# Patient Record
Sex: Female | Born: 1995 | Race: White | Hispanic: No | Marital: Single | Smoking: Never smoker
Health system: Southern US, Community
[De-identification: ages and names within clinical notes are randomized; demographics above are authoritative.]

## PROBLEM LIST (undated history)

## (undated) ENCOUNTER — Inpatient Hospital Stay (HOSPITAL_COMMUNITY): Payer: Self-pay

## (undated) DIAGNOSIS — Z3490 Encounter for supervision of normal pregnancy, unspecified, unspecified trimester: Secondary | ICD-10-CM

## (undated) DIAGNOSIS — D649 Anemia, unspecified: Secondary | ICD-10-CM

## (undated) HISTORY — PX: NO PAST SURGERIES: SHX2092

## (undated) HISTORY — DX: Anemia, unspecified: D64.9

---
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
## 2016-01-02 ENCOUNTER — Other Ambulatory Visit: Payer: Self-pay | Admitting: Obstetrics and Gynecology

## 2016-01-02 DIAGNOSIS — O209 Hemorrhage in early pregnancy, unspecified: Secondary | ICD-10-CM

## 2016-01-13 ENCOUNTER — Encounter: Payer: Self-pay | Admitting: Family

## 2016-01-13 ENCOUNTER — Ambulatory Visit (INDEPENDENT_AMBULATORY_CARE_PROVIDER_SITE_OTHER): Payer: Medicaid Other | Admitting: Clinical

## 2016-01-13 ENCOUNTER — Ambulatory Visit (INDEPENDENT_AMBULATORY_CARE_PROVIDER_SITE_OTHER): Payer: Medicaid Other | Admitting: Family

## 2016-01-13 VITALS — BP 125/74 | HR 83 | Ht 64.0 in | Wt 112.2 lb

## 2016-01-13 DIAGNOSIS — F4321 Adjustment disorder with depressed mood: Secondary | ICD-10-CM

## 2016-01-13 DIAGNOSIS — O039 Complete or unspecified spontaneous abortion without complication: Secondary | ICD-10-CM | POA: Diagnosis not present

## 2016-01-13 NOTE — Progress Notes (Signed)
States never took the cytotec because she passed blood .   States bled a lot the first day after mau visit and had a lot of cramps.  Then bled like a period for a few days. Then no bleeding until 01/11/16 which she thinks is a period again although it is not as heavy. Denies pain.

## 2016-01-13 NOTE — Progress Notes (Signed)
History   [id number]   Chief Complaint  Patient presents with  . Miscarriage    HPI Shirley Hall is a 20 y.o. female G1P0 here for follow-up after presumed SAB.  Upon review of the records patient was first seen on 12/18/15 for lower abdominal cramping and spotting of blood.   BHCG on that day was 89.  Ultrasound showed IUGS and embryo measuring 10.6 mm with no cardiac activity.  Pt discharged home with cytotec, reports not taking because bleeding and cramping started the next day and lasted for approximately a week.   Pt here today with report of bleeding that started two days ago, no report of cramping.   All other systems negative.    Patient's last menstrual period was 01/11/2016.  OB History  Gravida Para Term Preterm AB Living  1            SAB TAB Ectopic Multiple Live Births               # Outcome Date GA Lbr Len/2nd Weight Sex Delivery Anes PTL Lv  1 Gravida               History reviewed. No pertinent past medical history.  History reviewed. No pertinent family history.  Social History   Social History  . Marital status: Single    Spouse name: N/A  . Number of children: N/A  . Years of education: N/A   Social History Main Topics  . Smoking status: Never Smoker  . Smokeless tobacco: Never Used  . Alcohol use No  . Drug use: No  . Sexual activity: Yes    Birth control/ protection: Condom   Other Topics Concern  . None   Social History Narrative  . None    Allergies  Allergen Reactions  . Amoxapine And Related Other (See Comments)    States father advised her family history of allergy    Current Outpatient Prescriptions on File Prior to Visit  Medication Sig Dispense Refill  . ibuprofen (ADVIL,MOTRIN) 600 MG tablet Take 1 tablet (600 mg total) by mouth every 6 (six) hours as needed for cramping. 30 tablet 1  . misoprostol (CYTOTEC) 200 MCG tablet If you do not pass pregnancy tissue within 24 hours after the 1st dose, take second dose. If you do  not pass pregnancy tissue within 24 hours after 2nd dose call the office at [phone]. (Patient not taking: Reported on 01/13/2016) 4 tablet 1   No current facility-administered medications on file prior to visit.    Review of Systems  Genitourinary: Positive for vaginal bleeding. Negative for pelvic pain.  All other systems reviewed and are negative.    Physical Exam   Vitals:   01/13/16 0754  BP: 125/74  Pulse: 83  Weight: 112 lb 3.2 oz (50.9 kg)  Height: 5\' 4"  (1.626 m)    Physical Exam  Constitutional: She is oriented to person, place, and time. She appears well-developed and well-nourished.  HENT:  Head: Normocephalic.  Neck: Normal range of motion. Neck supple.  Cardiovascular: Normal rate, regular rhythm and normal heart sounds.   Respiratory: Effort normal and breath sounds normal. No respiratory distress.  GI: Soft. There is no tenderness.  Genitourinary: Uterus is not enlarged. Cervix exhibits no motion tenderness. There is bleeding (scant) in the vagina.  Musculoskeletal: Normal range of motion. She exhibits no edema.  Neurological: She is alert and oriented to person, place, and time.  Skin: Skin is warm and dry.  Assessment and Plan  SAB Follow-up   Plan: Repeat BHCG Discussed miscarriages and it's occurrence; does not mean she'll have another at this time Discussed normalcy of emotions (sadness) and when to report  Marlis Edelson, CNM 01/13/2016 9:11 AM

## 2016-01-13 NOTE — BH Specialist Note (Addendum)
Session Start time: 8:10   End Time: 8:30 Total Time:  20 minutes Type of Service: Behavioral Health - Individual/Family Interpreter: No.   Interpreter Name & Language: n/a # [facility]   SUBJECTIVE: Shirley Hall is a 20 y.o. female  Pt. was referred by Rochele PagesKarim Walidah, CNM for:  depression. Pt. reports the following symptoms/concerns: Pt states that although she has felt mildly depressed since her miscarriage, she feels she is coping well by working at [employer] and keeping herself busy with household chores. Duration of problem:  Less than one month Severity: mild Previous treatment: none   OBJECTIVE: Mood: Appropriate & Affect: Appropriate Risk of harm to self or others: no known risk of harm to self or others Assessments administered: PHQ9: 10/ GAD7: 7  LIFE CONTEXT:  School/ Work: Enjoys working [employer] job at [employer] Life changes: Miscarriage What is important to pt/family (values): Staying busy   GOALS ADDRESSED:  -Alleviate symptoms of depression  INTERVENTIONS: Strength-based   ASSESSMENT:  Pt currently experiencing Adjustment disorder with depressed mood.  Pt may benefit from psychoeducation and brief therapeutic intervention regarding coping with symptoms of depression.      PLAN: 1. F/U with behavioral health clinician: as needed 2. Behavioral Health meds: none 3. Behavioral recommendations:  -Continue working new work position as long as it lasts -Continue keeping self productive with housework, as long as it is helpful -Consider reading educational material regarding coping with symptoms of depression -Consider [facility] group support, as needed 4. Referral: Brief Counseling/Psychotherapy and Psychoeducation   Rae LipsJamie C Mcmannes LCSWA Behavioral Health Clinician  Marlon PelWarmhandoff:   Warm Hand Off Completed.         Depression screen PHQ 2/9 01/13/2016  Decreased Interest 1  Down, Depressed, Hopeless 1  PHQ - 2  Score 2  Altered sleeping 2  Tired, decreased energy 2  Change in appetite 3  Feeling bad or failure about yourself  1  Trouble concentrating 0  Moving slowly or fidgety/restless 0  Suicidal thoughts 0  PHQ-9 Score 10   GAD 7 : Generalized Anxiety Score 01/13/2016  Nervous, Anxious, on Edge 1  Control/stop worrying 2  Worry too much - different things 2  Trouble relaxing 2  Restless 0  Easily annoyed or irritable 0  Afraid - awful might happen 0  Total GAD 7 Score 7

## 2016-01-14 LAB — HCG, QUANTITATIVE, PREGNANCY: hCG, Beta Chain, Quant, S: <2 m[IU]/mL

## 2016-01-15 ENCOUNTER — Encounter (HOSPITAL_COMMUNITY): Payer: Self-pay

## 2016-01-15 ENCOUNTER — Ambulatory Visit (HOSPITAL_COMMUNITY)
Admission: RE | Admit: 2016-01-15 | Discharge: 2016-01-15 | Disposition: A | Discharge status: Home / Self Care | Payer: Medicaid Other | Source: Ambulatory Visit | Attending: Obstetrics and Gynecology | Admitting: Obstetrics and Gynecology

## 2016-01-15 DIAGNOSIS — O209 Hemorrhage in early pregnancy, unspecified: Secondary | ICD-10-CM

## 2016-03-28 NOTE — L&D Delivery Note (Signed)
  Delivery Note After a 15 minute 2nd stage, at 7:50 AM a viable female was delivered via Vaginal, Spontaneous Delivery (Presentation: LOA;  ).The shoulders were not forthcoming d/t a compound left hand/arm. The posterior (left) arm was released and the baby delivered  Total dystocia 1.5 minutes.  At no time was any traction placed on the baby's head.After 1 minute, the cord was clamped and cut. 40 units of pitocin diluted in 1000cc LR was infused rapidly IV.  The placenta separated spontaneously and delivered via CCT and maternal pushing effort.  It was inspected and appears to be intact with a 3 VC.  APGAR: ,7/8 ; weight  pending.    .  Cord pH: 7.08  Anesthesia:  epidural Episiotomy: None Lacerations: None Suture Repair:  Est. Blood Loss (mL): 75  Mom to postpartum.  Baby to Couplet care / Skin to Skin.  Shirley Hall,Shirley Hall 11/18/2016, 8:17 AM

## 2016-04-20 ENCOUNTER — Encounter: Payer: Self-pay | Admitting: Certified Nurse Midwife

## 2016-04-27 ENCOUNTER — Encounter: Payer: Self-pay | Admitting: Obstetrics and Gynecology

## 2016-04-28 ENCOUNTER — Other Ambulatory Visit (HOSPITAL_COMMUNITY)
Admission: RE | Admit: 2016-04-28 | Discharge: 2016-04-28 | Disposition: A | Discharge status: Home / Self Care | Payer: Medicaid Other | Source: Ambulatory Visit | Attending: Obstetrics and Gynecology | Admitting: Obstetrics and Gynecology

## 2016-04-28 ENCOUNTER — Ambulatory Visit (INDEPENDENT_AMBULATORY_CARE_PROVIDER_SITE_OTHER): Payer: Medicaid Other | Admitting: Obstetrics and Gynecology

## 2016-04-28 ENCOUNTER — Encounter: Payer: Self-pay | Admitting: Obstetrics and Gynecology

## 2016-04-28 DIAGNOSIS — Z113 Encounter for screening for infections with a predominantly sexual mode of transmission: Secondary | ICD-10-CM | POA: Diagnosis present

## 2016-04-28 DIAGNOSIS — Z3480 Encounter for supervision of other normal pregnancy, unspecified trimester: Secondary | ICD-10-CM

## 2016-04-28 DIAGNOSIS — Z3490 Encounter for supervision of normal pregnancy, unspecified, unspecified trimester: Secondary | ICD-10-CM | POA: Insufficient documentation

## 2016-04-28 DIAGNOSIS — Z3481 Encounter for supervision of other normal pregnancy, first trimester: Secondary | ICD-10-CM

## 2016-04-28 NOTE — Progress Notes (Signed)
Patient states that she feels good today. 

## 2016-04-28 NOTE — Progress Notes (Signed)
  Subjective:    Shirley Hall is a G2P0010 1056w3d being seen today for her first obstetrical visit.  Her obstetrical history is significant for first pregnancy. Patient does intend to breast feed. Pregnancy history fully reviewed. Patient is not currently enrolled in school  Patient reports some nausea.  Vitals:   04/28/16 1434  BP: 123/76  Pulse: 66  Temp: 98.4 F (36.9 C)  Weight: 112 lb (50.8 kg)    HISTORY: OB History  Gravida Para Term Preterm AB Living  2       1    SAB TAB Ectopic Multiple Live Births  1            # Outcome Date GA Lbr Len/2nd Weight Sex Delivery Anes PTL Lv  2 Current           1 SAB 11/2015 6639w4d            Past Medical History:  Diagnosis Date  . Anemia    History reviewed. No pertinent surgical history. History reviewed. No pertinent family history.   Exam    Uterus:   10-week size  Pelvic Exam:    Perineum: No Hemorrhoids, Normal Perineum   Vulva: normal   Vagina:  normal mucosa, normal discharge   pH:    Cervix: nulliparous appearance   Adnexa: normal adnexa and no mass, fullness, tenderness   Bony Pelvis: gynecoid  System: Breast:  normal appearance, no masses or tenderness   Skin: normal coloration and turgor, no rashes    Neurologic: oriented, no focal deficits   Extremities: normal strength, tone, and muscle mass   HEENT extra ocular movement intact   Mouth/Teeth mucous membranes moist, pharynx normal without lesions and dental hygiene good   Neck supple and no masses   Cardiovascular: regular rate and rhythm   Respiratory:  chest clear, no wheezing, crepitations, rhonchi, normal symmetric air entry   Abdomen: soft, non-tender; bowel sounds normal; no masses,  no organomegaly   Urinary:       Assessment:    Pregnancy: G2P0010 Patient Active Problem List   Diagnosis Date Noted  . Supervision of normal pregnancy, antepartum 04/28/2016        Plan:     Initial labs drawn. Prenatal vitamins. Problem list  reviewed and updated. Genetic Screening discussed First Screen: declined.  Ultrasound discussed; fetal survey: requested. Patient agreed to flu vaccine  Follow up in 4 weeks. 50% of 30 min visit spent on counseling and coordination of care.     Shirley Hall 04/28/2016

## 2016-04-28 NOTE — Patient Instructions (Signed)
 First Trimester of Pregnancy The first trimester of pregnancy is from week 1 until the end of week 12 (months 1 through 3). A week after a sperm fertilizes an egg, the egg will implant on the wall of the uterus. This embryo will begin to develop into a baby. Genes from you and your partner are forming the baby. The female genes determine whether the baby is a boy or a girl. At 6-8 weeks, the eyes and face are formed, and the heartbeat can be seen on ultrasound. At the end of 12 weeks, all the baby's organs are formed.  Now that you are pregnant, you will want to do everything you can to have a healthy baby. Two of the most important things are to get good prenatal care and to follow your health care provider's instructions. Prenatal care is all the medical care you receive before the baby's birth. This care will help prevent, find, and treat any problems during the pregnancy and childbirth. BODY CHANGES Your body goes through many changes during pregnancy. The changes vary from woman to woman.   You may gain or lose a couple of pounds at first.  You may feel sick to your stomach (nauseous) and throw up (vomit). If the vomiting is uncontrollable, call your health care provider.  You may tire easily.  You may develop headaches that can be relieved by medicines approved by your health care provider.  You may urinate more often. Painful urination may mean you have a bladder infection.  You may develop heartburn as a result of your pregnancy.  You may develop constipation because certain hormones are causing the muscles that push waste through your intestines to slow down.  You may develop hemorrhoids or swollen, bulging veins (varicose veins).  Your breasts may begin to grow larger and become tender. Your nipples may stick out more, and the tissue that surrounds them (areola) may become darker.  Your gums may bleed and may be sensitive to brushing and flossing.  Dark spots or blotches  (chloasma, mask of pregnancy) may develop on your face. This will likely fade after the baby is born.  Your menstrual periods will stop.  You may have a loss of appetite.  You may develop cravings for certain kinds of food.  You may have changes in your emotions from day to day, such as being excited to be pregnant or being concerned that something may go wrong with the pregnancy and baby.  You may have more vivid and strange dreams.  You may have changes in your hair. These can include thickening of your hair, rapid growth, and changes in texture. Some women also have hair loss during or after pregnancy, or hair that feels dry or thin. Your hair will most likely return to normal after your baby is born. WHAT TO EXPECT AT YOUR PRENATAL VISITS During a routine prenatal visit:  You will be weighed to make sure you and the baby are growing normally.  Your blood pressure will be taken.  Your abdomen will be measured to track your baby's growth.  The fetal heartbeat will be listened to starting around week 10 or 12 of your pregnancy.  Test results from any previous visits will be discussed. Your health care provider may ask you:  How you are feeling.  If you are feeling the baby move.  If you have had any abnormal symptoms, such as leaking fluid, bleeding, severe headaches, or abdominal cramping.  If you are using any tobacco   products, including cigarettes, chewing tobacco, and electronic cigarettes.  If you have any questions. Other tests that may be performed during your first trimester include:  Blood tests to find your blood type and to check for the presence of any previous infections. They will also be used to check for low iron levels (anemia) and Rh antibodies. Later in the pregnancy, blood tests for diabetes will be done along with other tests if problems develop.  Urine tests to check for infections, diabetes, or protein in the urine.  An ultrasound to confirm the  proper growth and development of the baby.  An amniocentesis to check for possible genetic problems.  Fetal screens for spina bifida and Down syndrome.  You may need other tests to make sure you and the baby are doing well.  HIV (human immunodeficiency virus) testing. Routine prenatal testing includes screening for HIV, unless you choose not to have this test. HOME CARE INSTRUCTIONS  Medicines   Follow your health care provider's instructions regarding medicine use. Specific medicines may be either safe or unsafe to take during pregnancy.  Take your prenatal vitamins as directed.  If you develop constipation, try taking a stool softener if your health care provider approves. Diet   Eat regular, well-balanced meals. Choose a variety of foods, such as meat or vegetable-based protein, fish, milk and low-fat dairy products, vegetables, fruits, and whole grain breads and cereals. Your health care provider will help you determine the amount of weight gain that is right for you.  Avoid raw meat and uncooked cheese. These carry germs that can cause birth defects in the baby.  Eating four or five small meals rather than three large meals a day may help relieve nausea and vomiting. If you start to feel nauseous, eating a few soda crackers can be helpful. Drinking liquids between meals instead of during meals also seems to help nausea and vomiting.  If you develop constipation, eat more high-fiber foods, such as fresh vegetables or fruit and whole grains. Drink enough fluids to keep your urine clear or pale yellow. Activity and Exercise   Exercise only as directed by your health care provider. Exercising will help you:  Control your weight.  Stay in shape.  Be prepared for labor and delivery.  Experiencing pain or cramping in the lower abdomen or low back is a good sign that you should stop exercising. Check with your health care provider before continuing normal exercises.  Try to avoid  standing for long periods of time. Move your legs often if you must stand in one place for a long time.  Avoid heavy lifting.  Wear low-heeled shoes, and practice good posture.  You may continue to have sex unless your health care provider directs you otherwise. Relief of Pain or Discomfort   Wear a good support bra for breast tenderness.   Take warm sitz baths to soothe any pain or discomfort caused by hemorrhoids. Use hemorrhoid cream if your health care provider approves.   Rest with your legs elevated if you have leg cramps or low back pain.  If you develop varicose veins in your legs, wear support hose. Elevate your feet for 15 minutes, 3-4 times a day. Limit salt in your diet. Prenatal Care   Schedule your prenatal visits by the twelfth week of pregnancy. They are usually scheduled monthly at first, then more often in the last 2 months before delivery.  Write down your questions. Take them to your prenatal visits.  Keep all your   prenatal visits as directed by your health care provider. Safety   Wear your seat belt at all times when driving.  Make a list of emergency phone numbers, including numbers for family, friends, the hospital, and police and fire departments. General Tips   Ask your health care provider for a referral to a local prenatal education class. Begin classes no later than at the beginning of month 6 of your pregnancy.  Ask for help if you have counseling or nutritional needs during pregnancy. Your health care provider can offer advice or refer you to specialists for help with various needs.  Do not use hot tubs, steam rooms, or saunas.  Do not douche or use tampons or scented sanitary pads.  Do not cross your legs for long periods of time.  Avoid cat litter boxes and soil used by cats. These carry germs that can cause birth defects in the baby and possibly loss of the fetus by miscarriage or stillbirth.  Avoid all smoking, herbs, alcohol, and  medicines not prescribed by your health care provider. Chemicals in these affect the formation and growth of the baby.  Do not use any tobacco products, including cigarettes, chewing tobacco, and electronic cigarettes. If you need help quitting, ask your health care provider. You may receive counseling support and other resources to help you quit.  Schedule a dentist appointment. At home, brush your teeth with a soft toothbrush and be gentle when you floss. SEEK MEDICAL CARE IF:   You have dizziness.  You have mild pelvic cramps, pelvic pressure, or nagging pain in the abdominal area.  You have persistent nausea, vomiting, or diarrhea.  You have a bad smelling vaginal discharge.  You have pain with urination.  You notice increased swelling in your face, hands, legs, or ankles. SEEK IMMEDIATE MEDICAL CARE IF:   You have a fever.  You are leaking fluid from your vagina.  You have spotting or bleeding from your vagina.  You have severe abdominal cramping or pain.  You have rapid weight gain or loss.  You vomit blood or material that looks like coffee grounds.  You are exposed to German measles and have never had them.  You are exposed to fifth disease or chickenpox.  You develop a severe headache.  You have shortness of breath.  You have any kind of trauma, such as from a fall or a car accident. This information is not intended to replace advice given to you by your health care provider. Make sure you discuss any questions you have with your health care provider. Document Released: 03/08/2001 Document Revised: 04/04/2014 Document Reviewed: 01/22/2013 Elsevier Interactive Patient Education  2017 [employer]  Contraception Choices Contraception (birth control) is the use of any methods or devices to prevent pregnancy. Below are some methods to help avoid pregnancy. Hormonal methods  Contraceptive implant. This is a thin, plastic tube containing progesterone hormone. It  does not contain estrogen hormone. Your health care provider inserts the tube in the inner part of the upper arm. The tube can remain in place for up to 3 years. After 3 years, the implant must be removed. The implant prevents the ovaries from releasing an egg (ovulation), thickens the cervical mucus to prevent sperm from entering the uterus, and thins the lining of the inside of the uterus.  Progesterone-only injections. These injections are given every 3 months by your health care provider to prevent pregnancy. This synthetic progesterone hormone stops the ovaries from releasing eggs. It also thickens cervical   mucus and changes the uterine lining. This makes it harder for sperm to survive in the uterus.  Birth control pills. These pills contain estrogen and progesterone hormone. They work by preventing the ovaries from releasing eggs (ovulation). They also cause the cervical mucus to thicken, preventing the sperm from entering the uterus. Birth control pills are prescribed by a health care provider.Birth control pills can also be used to treat heavy periods.  Minipill. This type of birth control pill contains only the progesterone hormone. They are taken every day of each month and must be prescribed by your health care provider.  Birth control patch. The patch contains hormones similar to those in birth control pills. It must be changed once a week and is prescribed by a health care provider.  Vaginal ring. The ring contains hormones similar to those in birth control pills. It is left in the vagina for 3 weeks, removed for 1 week, and then a new one is put back in place. The patient must be comfortable inserting and removing the ring from the vagina.A health care provider's prescription is necessary.  Emergency contraception. Emergency contraceptives prevent pregnancy after unprotected sexual intercourse. This pill can be taken right after sex or up to 5 days after unprotected sex. It is most  effective the sooner you take the pills after having sexual intercourse. Most emergency contraceptive pills are available without a prescription. Check with your pharmacist. Do not use emergency contraception as your only form of birth control. Barrier methods  Female condom. This is a thin sheath (latex or rubber) that is worn over the penis during sexual intercourse. It can be used with spermicide to increase effectiveness.  Female condom. This is a soft, loose-fitting sheath that is put into the vagina before sexual intercourse.  Diaphragm. This is a soft, latex, dome-shaped barrier that must be fitted by a health care provider. It is inserted into the vagina, along with a spermicidal jelly. It is inserted before intercourse. The diaphragm should be left in the vagina for 6 to 8 hours after intercourse.  Cervical cap. This is a round, soft, latex or plastic cup that fits over the cervix and must be fitted by a health care provider. The cap can be left in place for up to 48 hours after intercourse.  Sponge. This is a soft, circular piece of polyurethane foam. The sponge has spermicide in it. It is inserted into the vagina after wetting it and before sexual intercourse.  Spermicides. These are chemicals that kill or block sperm from entering the cervix and uterus. They come in the form of creams, jellies, suppositories, foam, or tablets. They do not require a prescription. They are inserted into the vagina with an applicator before having sexual intercourse. The process must be repeated every time you have sexual intercourse. Intrauterine contraception  Intrauterine device (IUD). This is a T-shaped device that is put in a woman's uterus during a menstrual period to prevent pregnancy. There are 2 types:  Copper IUD. This type of IUD is wrapped in copper wire and is placed inside the uterus. Copper makes the uterus and fallopian tubes produce a fluid that kills sperm. It can stay in place for 10  years.  Hormone IUD. This type of IUD contains the hormone progestin (synthetic progesterone). The hormone thickens the cervical mucus and prevents sperm from entering the uterus, and it also thins the uterine lining to prevent implantation of a fertilized egg. The hormone can weaken or kill the sperm that   get into the uterus. It can stay in place for 3-5 years, depending on which type of IUD is used. Permanent methods of contraception  Female tubal ligation. This is when the woman's fallopian tubes are surgically sealed, tied, or blocked to prevent the egg from traveling to the uterus.  Hysteroscopic sterilization. This involves placing a small coil or insert into each fallopian tube. Your doctor uses a technique called hysteroscopy to do the procedure. The device causes scar tissue to form. This results in permanent blockage of the fallopian tubes, so the sperm cannot fertilize the egg. It takes about 3 months after the procedure for the tubes to become blocked. You must use another form of birth control for these 3 months.  Female sterilization. This is when the female has the tubes that carry sperm tied off (vasectomy).This blocks sperm from entering the vagina during sexual intercourse. After the procedure, the man can still ejaculate fluid (semen). Natural planning methods  Natural family planning. This is not having sexual intercourse or using a barrier method (condom, diaphragm, cervical cap) on days the woman could become pregnant.  Calendar method. This is keeping track of the length of each menstrual cycle and identifying when you are fertile.  Ovulation method. This is avoiding sexual intercourse during ovulation.  Symptothermal method. This is avoiding sexual intercourse during ovulation, using a thermometer and ovulation symptoms.  Post-ovulation method. This is timing sexual intercourse after you have ovulated. Regardless of which type or method of contraception you choose, it is  important that you use condoms to protect against the transmission of sexually transmitted infections (STIs). Talk with your health care provider about which form of contraception is most appropriate for you. This information is not intended to replace advice given to you by your health care provider. Make sure you discuss any questions you have with your health care provider. Document Released: 03/14/2005 Document Revised: 08/20/2015 Document Reviewed: 09/06/2012 Elsevier Interactive Patient Education  2017 [employer]   Breastfeeding Deciding to breastfeed is one of the best choices you can make for you and your baby. A change in hormones during pregnancy causes your breast tissue to grow and increases the number and size of your milk ducts. These hormones also allow proteins, sugars, and fats from your blood supply to make breast milk in your milk-producing glands. Hormones prevent breast milk from being released before your baby is born as well as prompt milk flow after birth. Once breastfeeding has begun, thoughts of your baby, as well as his or her sucking or crying, can stimulate the release of milk from your milk-producing glands. Benefits of breastfeeding For Your Baby  Your first milk (colostrum) helps your baby's digestive system function better.  There are antibodies in your milk that help your baby fight off infections.  Your baby has a lower incidence of asthma, allergies, and sudden infant death syndrome.  The nutrients in breast milk are better for your baby than infant formulas and are designed uniquely for your baby's needs.  Breast milk improves your baby's brain development.  Your baby is less likely to develop other conditions, such as childhood obesity, asthma, or type 2 diabetes mellitus. For You  Breastfeeding helps to create a very special bond between you and your baby.  Breastfeeding is convenient. Breast milk is always available at the correct temperature and  costs nothing.  Breastfeeding helps to burn calories and helps you lose the weight gained during pregnancy.  Breastfeeding makes your uterus contract to its   prepregnancy size faster and slows bleeding (lochia) after you give birth.  Breastfeeding helps to lower your risk of developing type 2 diabetes mellitus, osteoporosis, and breast or ovarian cancer later in life. Signs that your baby is hungry Early Signs of Hunger  Increased alertness or activity.  Stretching.  Movement of the head from side to side.  Movement of the head and opening of the mouth when the corner of the mouth or cheek is stroked (rooting).  Increased sucking sounds, smacking lips, cooing, sighing, or squeaking.  Hand-to-mouth movements.  Increased sucking of fingers or hands. Late Signs of Hunger  Fussing.  Intermittent crying. Extreme Signs of Hunger  Signs of extreme hunger will require calming and consoling before your baby will be able to breastfeed successfully. Do not wait for the following signs of extreme hunger to occur before you initiate breastfeeding:  Restlessness.  A loud, strong cry.  Screaming. Breastfeeding basics  Breastfeeding Initiation  Find a comfortable place to sit or lie down, with your neck and back well supported.  Place a pillow or rolled up blanket under your baby to bring him or her to the level of your breast (if you are seated). Nursing pillows are specially designed to help support your arms and your baby while you breastfeed.  Make sure that your baby's abdomen is facing your abdomen.  Gently massage your breast. With your fingertips, massage from your chest wall toward your nipple in a circular motion. This encourages milk flow. You may need to continue this action during the feeding if your milk flows slowly.  Support your breast with 4 fingers underneath and your thumb above your nipple. Make sure your fingers are well away from your nipple and your baby's  mouth.  Stroke your baby's lips gently with your finger or nipple.  When your baby's mouth is open wide enough, quickly bring your baby to your breast, placing your entire nipple and as much of the colored area around your nipple (areola) as possible into your baby's mouth.  More areola should be visible above your baby's upper lip than below the lower lip.  Your baby's tongue should be between his or her lower gum and your breast.  Ensure that your baby's mouth is correctly positioned around your nipple (latched). Your baby's lips should create a seal on your breast and be turned out (everted).  It is common for your baby to suck about 2-3 minutes in order to start the flow of breast milk. Latching  Teaching your baby how to latch on to your breast properly is very important. An improper latch can cause nipple pain and decreased milk supply for you and poor weight gain in your baby. Also, if your baby is not latched onto your nipple properly, he or she may swallow some air during feeding. This can make your baby fussy. Burping your baby when you switch breasts during the feeding can help to get rid of the air. However, teaching your baby to latch on properly is still the best way to prevent fussiness from swallowing air while breastfeeding. Signs that your baby has successfully latched on to your nipple:  Silent tugging or silent sucking, without causing you pain.  Swallowing heard between every 3-4 sucks.  Muscle movement above and in front of his or her ears while sucking. Signs that your baby has not successfully latched on to nipple:  Sucking sounds or smacking sounds from your baby while breastfeeding.  Nipple pain. If you think your   baby has not latched on correctly, slip your finger into the corner of your baby's mouth to break the suction and place it between your baby's gums. Attempt breastfeeding initiation again. Signs of Successful Breastfeeding  Signs from your baby:  A  gradual decrease in the number of sucks or complete cessation of sucking.  Falling asleep.  Relaxation of his or her body.  Retention of a small amount of milk in his or her mouth.  Letting go of your breast by himself or herself. Signs from you:  Breasts that have increased in firmness, weight, and size 1-3 hours after feeding.  Breasts that are softer immediately after breastfeeding.  Increased milk volume, as well as a change in milk consistency and color by the fifth day of breastfeeding.  Nipples that are not sore, cracked, or bleeding. Signs That Your Baby is Getting Enough Milk  Wetting at least 1-2 diapers during the first 24 hours after birth.  Wetting at least 5-6 diapers every 24 hours for the first week after birth. The urine should be clear or pale yellow by 5 days after birth.  Wetting 6-8 diapers every 24 hours as your baby continues to grow and develop.  At least 3 stools in a 24-hour period by age 5 days. The stool should be soft and yellow.  At least 3 stools in a 24-hour period by age 7 days. The stool should be seedy and yellow.  No loss of weight greater than 10% of birth weight during the first 3 days of age.  Average weight gain of 4-7 ounces (113-198 g) per week after age 4 days.  Consistent daily weight gain by age 5 days, without weight loss after the age of 2 weeks. After a feeding, your baby may spit up a small amount. This is common. Breastfeeding frequency and duration Frequent feeding will help you make more milk and can prevent sore nipples and breast engorgement. Breastfeed when you feel the need to reduce the fullness of your breasts or when your baby shows signs of hunger. This is called "breastfeeding on demand." Avoid introducing a pacifier to your baby while you are working to establish breastfeeding (the first 4-6 weeks after your baby is born). After this time you may choose to use a pacifier. Research has shown that pacifier use during the  first year of a baby's life decreases the risk of sudden infant death syndrome (SIDS). Allow your baby to feed on each breast as long as he or she wants. Breastfeed until your baby is finished feeding. When your baby unlatches or falls asleep while feeding from the first breast, offer the second breast. Because newborns are often sleepy in the first few weeks of life, you may need to awaken your baby to get him or her to feed. Breastfeeding times will vary from baby to baby. However, the following rules can serve as a guide to help you ensure that your baby is properly fed:  Newborns (babies 4 weeks of age or younger) may breastfeed every 1-3 hours.  Newborns should not go longer than 3 hours during the day or 5 hours during the night without breastfeeding.  You should breastfeed your baby a minimum of 8 times in a 24-hour period until you begin to introduce solid foods to your baby at around 6 months of age. Breast milk pumping Pumping and storing breast milk allows you to ensure that your baby is exclusively fed your breast milk, even at times when you are unable   to breastfeed. This is especially important if you are going back to work while you are still breastfeeding or when you are not able to be present during feedings. Your lactation consultant can give you guidelines on how long it is safe to store breast milk. A breast pump is a machine that allows you to pump milk from your breast into a sterile bottle. The pumped breast milk can then be stored in a refrigerator or freezer. Some breast pumps are operated by hand, while others use electricity. Ask your lactation consultant which type will work best for you. Breast pumps can be purchased, but some hospitals and breastfeeding support groups lease breast pumps on a monthly basis. A lactation consultant can teach you how to hand express breast milk, if you prefer not to use a pump. Caring for your breasts while you breastfeed Nipples can become  dry, cracked, and sore while breastfeeding. The following recommendations can help keep your breasts moisturized and healthy:  Avoid using soap on your nipples.  Wear a supportive bra. Although not required, special nursing bras and tank tops are designed to allow access to your breasts for breastfeeding without taking off your entire bra or top. Avoid wearing underwire-style bras or extremely tight bras.  Air dry your nipples for 3-4minutes after each feeding.  Use only cotton bra pads to absorb leaked breast milk. Leaking of breast milk between feedings is normal.  Use lanolin on your nipples after breastfeeding. Lanolin helps to maintain your skin's normal moisture barrier. If you use pure lanolin, you do not need to wash it off before feeding your baby again. Pure lanolin is not toxic to your baby. You may also hand express a few drops of breast milk and gently massage that milk into your nipples and allow the milk to air dry. In the first few weeks after giving birth, some women experience extremely full breasts (engorgement). Engorgement can make your breasts feel heavy, warm, and tender to the touch. Engorgement peaks within 3-5 days after you give birth. The following recommendations can help ease engorgement:  Completely empty your breasts while breastfeeding or pumping. You may want to start by applying warm, moist heat (in the shower or with warm water-soaked hand towels) just before feeding or pumping. This increases circulation and helps the milk flow. If your baby does not completely empty your breasts while breastfeeding, pump any extra milk after he or she is finished.  Wear a snug bra (nursing or regular) or tank top for 1-2 days to signal your body to slightly decrease milk production.  Apply ice packs to your breasts, unless this is too uncomfortable for you.  Make sure that your baby is latched on and positioned properly while breastfeeding. If engorgement persists after 48  hours of following these recommendations, contact your health care provider or a lactation consultant. Overall health care recommendations while breastfeeding  Eat healthy foods. Alternate between meals and snacks, eating 3 of each per day. Because what you eat affects your breast milk, some of the foods may make your baby more irritable than usual. Avoid eating these foods if you are sure that they are negatively affecting your baby.  Drink milk, fruit juice, and water to satisfy your thirst (about 10 glasses a day).  Rest often, relax, and continue to take your prenatal vitamins to prevent fatigue, stress, and anemia.  Continue breast self-awareness checks.  Avoid chewing and smoking tobacco. Chemicals from cigarettes that pass into breast milk and exposure to   secondhand smoke may harm your baby.  Avoid alcohol and drug use, including marijuana. Some medicines that may be harmful to your baby can pass through breast milk. It is important to ask your health care provider before taking any medicine, including all over-the-counter and prescription medicine as well as vitamin and herbal supplements. It is possible to become pregnant while breastfeeding. If birth control is desired, ask your health care provider about options that will be safe for your baby. Contact a health care provider if:  You feel like you want to stop breastfeeding or have become frustrated with breastfeeding.  You have painful breasts or nipples.  Your nipples are cracked or bleeding.  Your breasts are red, tender, or warm.  You have a swollen area on either breast.  You have a fever or chills.  You have nausea or vomiting.  You have drainage other than breast milk from your nipples.  Your breasts do not become full before feedings by the fifth day after you give birth.  You feel sad and depressed.  Your baby is too sleepy to eat well.  Your baby is having trouble sleeping.  Your baby is wetting less  than 3 diapers in a 24-hour period.  Your baby has less than 3 stools in a 24-hour period.  Your baby's skin or the white part of his or her eyes becomes yellow.  Your baby is not gaining weight by 5 days of age. Get help right away if:  Your baby is overly tired (lethargic) and does not want to wake up and feed.  Your baby develops an unexplained fever. This information is not intended to replace advice given to you by your health care provider. Make sure you discuss any questions you have with your health care provider. Document Released: 03/14/2005 Document Revised: 08/26/2015 Document Reviewed: 09/05/2012 Elsevier Interactive Patient Education  2017 [employer]  

## 2016-04-29 LAB — GC/CHLAMYDIA PROBE AMP (~~LOC~~) NOT AT ARMC
Chlamydia: NEGATIVE
Neisseria Gonorrhea: NEGATIVE

## 2016-05-02 LAB — CULTURE, OB URINE

## 2016-05-02 LAB — URINE CULTURE, OB REFLEX

## 2016-05-03 LAB — OBSTETRIC PANEL, INCLUDING HIV
Antibody Screen: NEGATIVE
Basophils Absolute: 0.1 10*3/uL (ref 0.0–0.2)
Basos: 1 %
EOS (ABSOLUTE): 0.1 10*3/uL (ref 0.0–0.4)
Eos: 2 %
HEMOGLOBIN: 11.3 g/dL (ref 11.1–15.9)
HIV Screen 4th Generation wRfx: NONREACTIVE
Hematocrit: 35.6 % (ref 34.0–46.6)
Hepatitis B Surface Ag: NEGATIVE
IMMATURE GRANS (ABS): 0 10*3/uL (ref 0.0–0.1)
IMMATURE GRANULOCYTES: 0 %
LYMPHS: 23 %
Lymphocytes Absolute: 1.6 10*3/uL (ref 0.7–3.1)
MCH: 29 pg (ref 26.6–33.0)
MCHC: 31.7 g/dL (ref 31.5–35.7)
MCV: 91 fL (ref 79–97)
MONOCYTES: 8 %
MONOS ABS: 0.5 10*3/uL (ref 0.1–0.9)
NEUTROS PCT: 66 %
Neutrophils Absolute: 4.5 10*3/uL (ref 1.4–7.0)
Platelets: 334 10*3/uL (ref 150–379)
RBC: 3.9 x10E6/uL (ref 3.77–5.28)
RDW: 15 % (ref 12.3–15.4)
RPR Ser Ql: NONREACTIVE
Rh Factor: POSITIVE
Rubella Antibodies, IGG: 1.71 index (ref >0.99)
WBC: 6.9 10*3/uL (ref 3.4–10.8)

## 2016-05-03 LAB — HEMOGLOBINOPATHY EVALUATION
HEMOGLOBIN F QUANTITATION: 0.9 % (ref 0.0–2.0)
HGB A: 96.4 % (ref 96.4–98.8)
HGB C: 0 %
HGB S: 0 %
HGB VARIANT: 0 %
Hemoglobin A2 Quantitation: 2.7 % (ref 1.8–3.2)

## 2016-05-04 LAB — TOXASSURE SELECT 13 (MW), URINE

## 2016-05-26 ENCOUNTER — Ambulatory Visit (INDEPENDENT_AMBULATORY_CARE_PROVIDER_SITE_OTHER): Payer: Medicaid Other | Admitting: Obstetrics and Gynecology

## 2016-05-26 VITALS — BP 99/64 | HR 74 | Wt 117.0 lb

## 2016-05-26 DIAGNOSIS — Z23 Encounter for immunization: Secondary | ICD-10-CM | POA: Diagnosis not present

## 2016-05-26 DIAGNOSIS — Z3482 Encounter for supervision of other normal pregnancy, second trimester: Secondary | ICD-10-CM

## 2016-05-26 DIAGNOSIS — Z3480 Encounter for supervision of other normal pregnancy, unspecified trimester: Secondary | ICD-10-CM

## 2016-05-26 NOTE — Progress Notes (Signed)
Subjective:  Shirley Hall is a 21 y.o. G2P0010 at 5156w3d being seen today for ongoing prenatal care.  She is currently monitored for the following issues for this low-risk pregnancy and has Supervision of normal pregnancy, antepartum on her problem list.  Patient reports no complaints.  Contractions: Not present. Vag. Bleeding: None.  Movement: Absent. Denies leaking of fluid.   The following portions of the patient's history were reviewed and updated as appropriate: allergies, current medications, past family history, past medical history, past social history, past surgical history and problem list. Problem list updated.  Objective:   Vitals:   05/26/16 1357  BP: 99/64  Pulse: 74  Weight: 117 lb (53.1 kg)    Fetal Status: Fetal Heart Rate (bpm): 130   Movement: Absent     General:  Alert, oriented and cooperative. Patient is in no acute distress.  Skin: Skin is warm and dry. No rash noted.   Cardiovascular: Normal heart rate noted  Respiratory: Normal respiratory effort, no problems with respiration noted  Abdomen: Soft, gravid, appropriate for gestational age. Pain/Pressure: Absent     Pelvic:  Cervical exam deferred        Extremities: Normal range of motion.     Mental Status: Normal mood and affect. Normal behavior. Normal judgment and thought content.   Urinalysis:      Assessment and Plan:  Pregnancy: G2P0010 at 6556w3d  1. Supervision of other normal pregnancy, antepartum Flu vaccine today Schedule anatomy U/S at next OB visit Discussed AFP, offer at next OB visit as well  Preterm labor symptoms and general obstetric precautions including but not limited to vaginal bleeding, contractions, leaking of fluid and fetal movement were reviewed in detail with the patient. Please refer to After Visit Summary for other counseling recommendations.  Return in about 4 weeks (around 06/23/2016) for OB visit.   Hermina StaggersMichael L Ragnar Waas, MD

## 2016-05-26 NOTE — Patient Instructions (Addendum)
Second Trimester of Pregnancy The second trimester is from week 14 through week 27 (months 4 through 6). The second trimester is often a time when you feel your best. Your body has adjusted to being pregnant, and you begin to feel better physically. Usually, morning sickness has lessened or quit completely, you may have more energy, and you may have an increase in appetite. The second trimester is also a time when the fetus is growing rapidly. At the end of the sixth month, the fetus is about 9 inches long and weighs about 1 pounds. You will likely begin to feel the baby move (quickening) between 16 and 20 weeks of pregnancy. Body changes during your second trimester Your body continues to go through many changes during your second trimester. The changes vary from woman to woman.  Your weight will continue to increase. You will notice your lower abdomen bulging out.  You may begin to get stretch marks on your hips, abdomen, and breasts.  You may develop headaches that can be relieved by medicines. The medicines should be approved by your health care provider.  You may urinate more often because the fetus is pressing on your bladder.  You may develop or continue to have heartburn as a result of your pregnancy.  You may develop constipation because certain hormones are causing the muscles that push waste through your intestines to slow down.  You may develop hemorrhoids or swollen, bulging veins (varicose veins).  You may have back pain. This is caused by:  Weight gain.  Pregnancy hormones that are relaxing the joints in your pelvis.  A shift in weight and the muscles that support your balance.  Your breasts will continue to grow and they will continue to become tender.  Your gums may bleed and may be sensitive to brushing and flossing.  Dark spots or blotches (chloasma, mask of pregnancy) may develop on your face. This will likely fade after the baby is born.  A dark line from your  belly button to the pubic area (linea nigra) may appear. This will likely fade after the baby is born.  You may have changes in your hair. These can include thickening of your hair, rapid growth, and changes in texture. Some women also have hair loss during or after pregnancy, or hair that feels dry or thin. Your hair will most likely return to normal after your baby is born. What to expect at prenatal visits During a routine prenatal visit:  You will be weighed to make sure you and the fetus are growing normally.  Your blood pressure will be taken.  Your abdomen will be measured to track your baby's growth.  The fetal heartbeat will be listened to.  Any test results from the previous visit will be discussed. Your health care provider may ask you:  How you are feeling.  If you are feeling the baby move.  If you have had any abnormal symptoms, such as leaking fluid, bleeding, severe headaches, or abdominal cramping.  If you are using any tobacco products, including cigarettes, chewing tobacco, and electronic cigarettes.  If you have any questions. Other tests that may be performed during your second trimester include:  Blood tests that check for:  Low iron levels (anemia).  High blood sugar that affects pregnant women (gestational diabetes) between 24 and 28 weeks.  Rh antibodies. This is to check for a protein on red blood cells (Rh factor).  Urine tests to check for infections, diabetes, or protein in the   urine.  An ultrasound to confirm the proper growth and development of the baby.  An amniocentesis to check for possible genetic problems.  Fetal screens for spina bifida and Down syndrome.  HIV (human immunodeficiency virus) testing. Routine prenatal testing includes screening for HIV, unless you choose not to have this test. Follow these instructions at home: Medicines   Follow your health care provider's instructions regarding medicine use. Specific medicines may  be either safe or unsafe to take during pregnancy.  Take a prenatal vitamin that contains at least 600 micrograms (mcg) of folic acid.  If you develop constipation, try taking a stool softener if your health care provider approves. Eating and drinking   Eat a balanced diet that includes fresh fruits and vegetables, whole grains, good sources of protein such as meat, eggs, or tofu, and low-fat dairy. Your health care provider will help you determine the amount of weight gain that is right for you.  Avoid raw meat and uncooked cheese. These carry germs that can cause birth defects in the baby.  If you have low calcium intake from food, talk to your health care provider about whether you should take a daily calcium supplement.  Limit foods that are high in fat and processed sugars, such as fried and sweet foods.  To prevent constipation:  Drink enough fluid to keep your urine clear or pale yellow.  Eat foods that are high in fiber, such as fresh fruits and vegetables, whole grains, and beans. Activity   Exercise only as directed by your health care provider. Most women can continue their usual exercise routine during pregnancy. Try to exercise for 30 minutes at least 5 days a week. Stop exercising if you experience uterine contractions.  Avoid heavy lifting, wear low heel shoes, and practice good posture.  A sexual relationship may be continued unless your health care provider directs you otherwise. Relieving pain and discomfort   Wear a good support bra to prevent discomfort from breast tenderness.  Take warm sitz baths to soothe any pain or discomfort caused by hemorrhoids. Use hemorrhoid cream if your health care provider approves.  Rest with your legs elevated if you have leg cramps or low back pain.  If you develop varicose veins, wear support hose. Elevate your feet for 15 minutes, 3-4 times a day. Limit salt in your diet. Prenatal Care   Write down your questions. Take them  to your prenatal visits.  Keep all your prenatal visits as told by your health care provider. This is important. Safety   Wear your seat belt at all times when driving.  Make a list of emergency phone numbers, including numbers for family, friends, the hospital, and police and fire departments. General instructions   Ask your health care provider for a referral to a local prenatal education class. Begin classes no later than the beginning of month 6 of your pregnancy.  Ask for help if you have counseling or nutritional needs during pregnancy. Your health care provider can offer advice or refer you to specialists for help with various needs.  Do not use hot tubs, steam rooms, or saunas.  Do not douche or use tampons or scented sanitary pads.  Do not cross your legs for long periods of time.  Avoid cat litter boxes and soil used by cats. These carry germs that can cause birth defects in the baby and possibly loss of the fetus by miscarriage or stillbirth.  Avoid all smoking, herbs, alcohol, and unprescribed drugs. Chemicals in   these products can affect the formation and growth of the baby.  Do not use any products that contain nicotine or tobacco, such as cigarettes and e-cigarettes. If you need help quitting, ask your health care provider.  Visit your dentist if you have not gone yet during your pregnancy. Use a soft toothbrush to brush your teeth and be gentle when you floss. Contact a health care provider if:  You have dizziness.  You have mild pelvic cramps, pelvic pressure, or nagging pain in the abdominal area.  You have persistent nausea, vomiting, or diarrhea.  You have a bad smelling vaginal discharge.  You have pain when you urinate. Get help right away if:  You have a fever.  You are leaking fluid from your vagina.  You have spotting or bleeding from your vagina.  You have severe abdominal cramping or pain.  You have rapid weight gain or weight loss.  You  have shortness of breath with chest pain.  You notice sudden or extreme swelling of your face, hands, ankles, feet, or legs.  You have not felt your baby move in over an hour.  You have severe headaches that do not go away when you take medicine.  You have vision changes. Summary  The second trimester is from week 14 through week 27 (months 4 through 6). It is also a time when the fetus is growing rapidly.  Your body goes through many changes during pregnancy. The changes vary from woman to woman.  Avoid all smoking, herbs, alcohol, and unprescribed drugs. These chemicals affect the formation and growth your baby.  Do not use any tobacco products, such as cigarettes, chewing tobacco, and e-cigarettes. If you need help quitting, ask your health care provider.  Contact your health care provider if you have any questions. Keep all prenatal visits as told by your health care provider. This is important. This information is not intended to replace advice given to you by your health care provider. Make sure you discuss any questions you have with your health care provider. Document Released: 03/08/2001 Document Revised: 08/20/2015 Document Reviewed: 05/15/2012 Elsevier Interactive Patient Education  2017 [employer] Alpha-Fetoprotein Test Why am I having this test? This test is used to screen for birth defects, such as chromosomal abnormalities, neural tube defects, and body wall defects. It can also be used as a tumor marker for certain cancers. Alpha-Fetoprotein (AFP) is a protein that is made by the fetal liver. Levels can be detected during pregnancy, starting at [redacted] weeks gestation and peaking at 16-[redacted] weeks gestation. Your health care provider may perform this test if you are pregnant or if a tumor is suspected. What kind of sample is taken? A blood sample is required for this test. It is usually collected by inserting a needle into a vein. How do I prepare for this test? There is no  preparation or fasting required for this test. What are the reference ranges? References ranges are considered healthy ranges established after testing a large group of healthy people. Reference ranges may vary among different people, labs, and hospitals. It is your responsibility to obtain your test results. Ask the lab or department performing the test when and how you will get your results. Reference ranges for AFP are the following:  Adult: Less than 40 ng/mL or less than 40 mcg/L (SI units).  Child younger than 1 year: Less than 30 ng/mL. Ranges are stratified by weeks of gestation, and they vary among laboratories. What do the results mean?   Values above the reference ranges in pregnant women may indicate:  Neural tube defects.  Abdominal wall defects.  Multiple pregnancy.  Congenital abnormalities.  Fetal distress or fetal death. Values above the reference ranges in nonpregnant women may indicate:  Reproductive cancers.  Liver cancer.  Liver cell death.  Other types of cancer. Values below the reference ranges in pregnant women may indicate:  Down syndrome.  Fetal death. Talk with your health care provider to discuss your results, treatment options, and if necessary, the need for more tests. Talk with your health care provider if you have any questions about your results. Talk with your health care provider to discuss your results, treatment options, and if necessary, the need for more tests. Talk with your health care provider if you have any questions about your results. This information is not intended to replace advice given to you by your health care provider. Make sure you discuss any questions you have with your health care provider. Document Released: 04/07/2004 Document Revised: 11/16/2015 Document Reviewed: 08/16/2013 Elsevier Interactive Patient Education  2017 [employer]  

## 2016-06-17 ENCOUNTER — Encounter (HOSPITAL_COMMUNITY): Payer: Self-pay | Admitting: *Deleted

## 2016-06-17 ENCOUNTER — Inpatient Hospital Stay (HOSPITAL_COMMUNITY): Payer: Medicaid Other

## 2016-06-17 ENCOUNTER — Inpatient Hospital Stay (HOSPITAL_COMMUNITY)
Admission: AD | Admit: 2016-06-17 | Discharge: 2016-06-17 | Disposition: A | Discharge status: Home / Self Care | Payer: Medicaid Other | Source: Ambulatory Visit | Attending: Obstetrics & Gynecology | Admitting: Obstetrics & Gynecology

## 2016-06-17 DIAGNOSIS — N760 Acute vaginitis: Secondary | ICD-10-CM | POA: Diagnosis not present

## 2016-06-17 DIAGNOSIS — O23592 Infection of other part of genital tract in pregnancy, second trimester: Secondary | ICD-10-CM | POA: Diagnosis not present

## 2016-06-17 DIAGNOSIS — B9689 Other specified bacterial agents as the cause of diseases classified elsewhere: Secondary | ICD-10-CM | POA: Diagnosis not present

## 2016-06-17 DIAGNOSIS — Z3A17 17 weeks gestation of pregnancy: Secondary | ICD-10-CM | POA: Diagnosis not present

## 2016-06-17 DIAGNOSIS — O4692 Antepartum hemorrhage, unspecified, second trimester: Secondary | ICD-10-CM | POA: Insufficient documentation

## 2016-06-17 LAB — WET PREP, GENITAL
Sperm: NONE SEEN
Trich, Wet Prep: NONE SEEN
Yeast Wet Prep HPF POC: NONE SEEN

## 2016-06-17 LAB — URINALYSIS, ROUTINE W REFLEX MICROSCOPIC
BILIRUBIN URINE: NEGATIVE
GLUCOSE, UA: NEGATIVE mg/dL
KETONES UR: NEGATIVE mg/dL
LEUKOCYTES UA: NEGATIVE
Nitrite: NEGATIVE
PH: 6 (ref 5.0–8.0)
PROTEIN: NEGATIVE mg/dL
Specific Gravity, Urine: 1.02 (ref 1.005–1.030)

## 2016-06-17 MED ORDER — METRONIDAZOLE 500 MG PO TABS: 500.0000 mg | ORAL_TABLET | Freq: Two times a day (BID) | ORAL | 0 refills | Status: DC

## 2016-06-17 NOTE — Discharge Instructions (Signed)
Bacterial Vaginosis °Bacterial vaginosis is a vaginal infection that occurs when the normal balance of bacteria in the vagina is disrupted. It results from an overgrowth of certain bacteria. This is the most common vaginal infection among women ages 15-44. °Because bacterial vaginosis increases your risk for STIs (sexually transmitted infections), getting treated can help reduce your risk for chlamydia, gonorrhea, herpes, and HIV (human immunodeficiency virus). Treatment is also important for preventing complications in pregnant women, because this condition can cause an early (premature) delivery. °What are the causes? °This condition is caused by an increase in harmful bacteria that are normally present in small amounts in the vagina. However, the reason that the condition develops is not fully understood. °What increases the risk? °The following factors may make you more likely to develop this condition: °· Having a new sexual partner or multiple sexual partners. °· Having unprotected sex. °· Douching. °· Having an intrauterine device (IUD). °· Smoking. °· Drug and alcohol abuse. °· Taking certain antibiotic medicines. °· Being pregnant. °You cannot get bacterial vaginosis from toilet seats, bedding, swimming pools, or contact with objects around you. °What are the signs or symptoms? °Symptoms of this condition include: °· Grey or white vaginal discharge. The discharge can also be watery or foamy. °· A fish-like odor with discharge, especially after sexual intercourse or during menstruation. °· Itching in and around the vagina. °· Burning or pain with urination. °Some women with bacterial vaginosis have no signs or symptoms. °How is this diagnosed? °This condition is diagnosed based on: °· Your medical history. °· A physical exam of the vagina. °· Testing a sample of vaginal fluid under a microscope to look for a large amount of bad bacteria or abnormal cells. Your health care provider may use a cotton swab or a  small wooden spatula to collect the sample. °How is this treated? °This condition is treated with antibiotics. These may be given as a pill, a vaginal cream, or a medicine that is put into the vagina (suppository). If the condition comes back after treatment, a second round of antibiotics may be needed. °Follow these instructions at home: °Medicines  °· Take over-the-counter and prescription medicines only as told by your health care provider. °· Take or use your antibiotic as told by your health care provider. Do not stop taking or using the antibiotic even if you start to feel better. °General instructions  °· If you have a female sexual partner, tell her that you have a vaginal infection. She should see her health care provider and be treated if she has symptoms. If you have a female sexual partner, he does not need treatment. °· During treatment: °¨ Avoid sexual activity until you finish treatment. °¨ Do not douche. °¨ Avoid alcohol as directed by your health care provider. °¨ Avoid breastfeeding as directed by your health care provider. °· Drink enough water and fluids to keep your urine clear or pale yellow. °· Keep the area around your vagina and rectum clean. °¨ Wash the area daily with warm water. °¨ Wipe yourself from front to back after using the toilet. °· Keep all follow-up visits as told by your health care provider. This is important. °How is this prevented? °· Do not douche. °· Wash the outside of your vagina with warm water only. °· Use protection when having sex. This includes latex condoms and dental dams. °· Limit how many sexual partners you have. To help prevent bacterial vaginosis, it is best to have sex with just one   partner (monogamous). °· Make sure you and your sexual partner are tested for STIs. °· Wear cotton or cotton-lined underwear. °· Avoid wearing tight pants and pantyhose, especially during summer. °· Limit the amount of alcohol that you drink. °· Do not use any products that contain  nicotine or tobacco, such as cigarettes and e-cigarettes. If you need help quitting, ask your health care provider. °· Do not use illegal drugs. °Where to find more information: °· Centers for Disease Control and Prevention: www.cdc.gov/std °· American Sexual Health Association (ASHA): www.ashastd.org °· U.S. Department of Health and Human Services, Office on Women's Health: www.womenshealth.gov/ or https://www.womenshealth.gov/a-z-topics/bacterial-vaginosis °Contact a health care provider if: °· Your symptoms do not improve, even after treatment. °· You have more discharge or pain when urinating. °· You have a fever. °· You have pain in your abdomen. °· You have pain during sex. °· You have vaginal bleeding between periods. °Summary °· Bacterial vaginosis is a vaginal infection that occurs when the normal balance of bacteria in the vagina is disrupted. °· Because bacterial vaginosis increases your risk for STIs (sexually transmitted infections), getting treated can help reduce your risk for chlamydia, gonorrhea, herpes, and HIV (human immunodeficiency virus). Treatment is also important for preventing complications in pregnant women, because the condition can cause an early (premature) delivery. °· This condition is treated with antibiotic medicines. These may be given as a pill, a vaginal cream, or a medicine that is put into the vagina (suppository). °This information is not intended to replace advice given to you by your health care provider. Make sure you discuss any questions you have with your health care provider. °Document Released: 03/14/2005 Document Revised: 11/28/2015 Document Reviewed: 11/28/2015 °Elsevier Interactive Patient Education © 2017 [employer] ° °Vaginal Bleeding During Pregnancy, Second Trimester °A small amount of bleeding (spotting) from the vagina is relatively common in pregnancy. It usually stops on its own. Various things can cause bleeding or spotting in pregnancy. Some bleeding  may be related to the pregnancy, and some may not. Sometimes the bleeding is normal and is not a problem. However, bleeding can also be a sign of something serious. Be sure to tell your health care provider about any vaginal bleeding right away. °Some possible causes of vaginal bleeding during the second trimester include: °· Infection, inflammation, or growths on the cervix. °· The placenta may be partially or completely covering the opening of the cervix inside the uterus (placenta previa). °· The placenta may have separated from the uterus (abruption of the placenta). °· You may be having early (preterm) labor. °· The cervix may not be strong enough to keep a baby inside the uterus (cervical insufficiency). °· Tiny cysts may have developed in the uterus instead of pregnancy tissue (molar pregnancy). °Follow these instructions at home: °Watch your condition for any changes. The following actions may help to lessen any discomfort you are feeling: °· Follow your health care provider's instructions for limiting your activity. If your health care provider orders bed rest, you may need to stay in bed and only get up to use the bathroom. However, your health care provider may allow you to continue light activity. °· If needed, make plans for someone to help with your regular activities and responsibilities while you are on bed rest. °· Keep track of the number of pads you use each day, how often you change pads, and how soaked (saturated) they are. Write this down. °· Do not use tampons. Do not douche. °· Do not have   sexual intercourse or orgasms until approved by your health care provider. °· If you pass any tissue from your vagina, save the tissue so you can show it to your health care provider. °· Only take over-the-counter or prescription medicines as directed by your health care provider. °· Do not take aspirin because it can make you bleed. °· Do not exercise or perform any strenuous activities or heavy lifting  without your health care provider's permission. °· Keep all follow-up appointments as directed by your health care provider. °Contact a health care provider if: °· You have any vaginal bleeding during any part of your pregnancy. °· You have cramps or labor pains. °· You have a fever, not controlled by medicine. °Get help right away if: °· You have severe cramps in your back or belly (abdomen). °· You have contractions. °· You have chills. °· You pass large clots or tissue from your vagina. °· Your bleeding increases. °· You feel light-headed or weak, or you have fainting episodes. °· You are leaking fluid or have a gush of fluid from your vagina. °This information is not intended to replace advice given to you by your health care provider. Make sure you discuss any questions you have with your health care provider. °Document Released: 12/22/2004 Document Revised: 08/20/2015 Document Reviewed: 11/19/2012 °Elsevier Interactive Patient Education © 2017 Elsevier Inc. ° °

## 2016-06-17 NOTE — MAU Provider Note (Signed)
History     CSN: [id number]  Arrival date and time: 06/17/16 1747   First Provider Initiated Contact with Patient 06/17/16 2014      Chief Complaint  Patient presents with  . Vaginal Bleeding  . Abdominal Cramping   HPI Shirley Hall is a 21 y.o. G2P0010 at [redacted]w[redacted]d who presents with vaginal bleeding & abdominal cramping. Symptoms began this morning. Reports having 1 sharp abdominal cramping that lasted for a few seconds this morning at 1 am; no pain since then. Has noted pink discharge in underwear today; no clots or bright red bleeding. Denies n/v/d, constipation, dysuria. Had intercourse last night.  Also reports 1 episode of substernal pain 3 days ago that lasted for 30 minutes & resolved without intervention. No chest pain since then. Denies SOB, heartburn, palpitations, or cough.   OB History    Gravida Para Term Preterm AB Living   2       1     SAB TAB Ectopic Multiple Live Births   1              Past Medical History:  Diagnosis Date  . Anemia     Past Surgical History:  Procedure Laterality Date  . NO PAST SURGERIES      No family history on file.  Social History  Substance Use Topics  . Smoking status: Never Smoker  . Smokeless tobacco: Never Used  . Alcohol use No    Allergies:  Allergies  Allergen Reactions  . Amoxapine And Related Other (See Comments)    States father advised her family history of allergy    Prescriptions Prior to Admission  Medication Sig Dispense Refill Last Dose  . Prenatal Vit-Fe Fumarate-FA (PRENATAL MULTIVITAMIN) TABS tablet Take 1 tablet by mouth daily at 12 noon.   Taking    Review of Systems  Constitutional: Negative.   Gastrointestinal: Positive for abdominal pain. Negative for constipation, diarrhea, nausea and vomiting.  Genitourinary: Positive for vaginal bleeding. Negative for dysuria and vaginal discharge.   Physical Exam   Blood pressure 109/65, pulse 90, temperature 97.7 F (36.5 C), temperature source  Oral, resp. rate 16, height 5\' 5"  (1.651 m), weight 119 lb 1.9 oz (54 kg), last menstrual period 02/15/2016, SpO2 100 %, unknown if currently breastfeeding.  Physical Exam  Nursing note and vitals reviewed. Constitutional: She is oriented to person, place, and time. She appears well-developed and well-nourished. No distress.  HENT:  Head: Normocephalic and atraumatic.  Eyes: Conjunctivae are normal. Right eye exhibits no discharge. Left eye exhibits no discharge. No scleral icterus.  Neck: Normal range of motion.  Respiratory: Effort normal. No respiratory distress.  GI: Soft. There is no tenderness.  Genitourinary: Cervix exhibits no friability. There is bleeding (minimal amount of bright red blood at cervical os,no active bleeding) in the vagina. Vaginal discharge (small amount of thin tan discharge) found.  Genitourinary Comments: Cervix visually closed  Neurological: She is alert and oriented to person, place, and time.  Skin: Skin is warm and dry. She is not diaphoretic.  Psychiatric: She has a normal mood and affect. Her behavior is normal. Judgment and thought content normal.    MAU Course  Procedures Results for orders placed or performed during the hospital encounter of 06/17/16 (from the past 24 hour(s))  Urinalysis, Routine w reflex microscopic     Status: Abnormal   Collection Time: 06/17/16  5:58 PM  Result Value Ref Range   Color, Urine YELLOW YELLOW   APPearance HAZY (  A) CLEAR   Specific Gravity, Urine 1.020 1.005 - 1.030   pH 6.0 5.0 - 8.0   Glucose, UA NEGATIVE NEGATIVE mg/dL   Hgb urine dipstick SMALL (A) NEGATIVE   Bilirubin Urine NEGATIVE NEGATIVE   Ketones, ur NEGATIVE NEGATIVE mg/dL   Protein, ur NEGATIVE NEGATIVE mg/dL   Nitrite NEGATIVE NEGATIVE   Leukocytes, UA NEGATIVE NEGATIVE   RBC / HPF 0-5 0 - 5 RBC/hpf   WBC, UA 6-30 0 - 5 WBC/hpf   Bacteria, UA RARE (A) NONE SEEN   Squamous Epithelial / LPF 6-30 (A) NONE SEEN   Mucous PRESENT   Wet prep, genital      Status: Abnormal   Collection Time: 06/17/16  8:25 PM  Result Value Ref Range   Yeast Wet Prep HPF POC NONE SEEN NONE SEEN   Trich, Wet Prep NONE SEEN NONE SEEN   Clue Cells Wet Prep HPF POC PRESENT (A) NONE SEEN   WBC, Wet Prep HPF POC FEW (A) NONE SEEN   Sperm NONE SEEN     MDM FHT 150 by doppler Cervix visually closed Ultrasound -- no placenta previa, no evidence of abruption, Cervical length 3.8 cm  Assessment and Plan  A: 1. BV (bacterial vaginosis)   2. [redacted] weeks gestation of pregnancy   3. Vaginal bleeding in pregnancy, second trimester    P: Discharge home Rx flagyl Pelvic rest Discussed reasons to return to MAU Keep follow up appointment with OB/PCP  GC/CT pending   Judeth Hornrin Tlaloc Taddei 06/17/2016, 8:14 PM

## 2016-06-17 NOTE — MAU Note (Signed)
Patient states when she went to the bathroom and wiped noticing pink vaginal bleeding. Abdominal cramping once this morning but not since then. Chest pain 3-4 days ago mid chest; went on for half and hour and went away. Denies pain at this time. Denies vaginal discharge.

## 2016-06-20 LAB — GC/CHLAMYDIA PROBE AMP (~~LOC~~) NOT AT ARMC
Chlamydia: NEGATIVE
Neisseria Gonorrhea: NEGATIVE

## 2016-06-22 ENCOUNTER — Encounter (HOSPITAL_COMMUNITY): Payer: Self-pay | Admitting: *Deleted

## 2016-06-22 ENCOUNTER — Inpatient Hospital Stay (HOSPITAL_COMMUNITY)
Admission: AD | Admit: 2016-06-22 | Discharge: 2016-06-22 | Disposition: A | Discharge status: Home / Self Care | Payer: Medicaid Other | Source: Ambulatory Visit | Attending: Family Medicine | Admitting: Family Medicine

## 2016-06-22 DIAGNOSIS — O209 Hemorrhage in early pregnancy, unspecified: Secondary | ICD-10-CM | POA: Insufficient documentation

## 2016-06-22 DIAGNOSIS — N898 Other specified noninflammatory disorders of vagina: Secondary | ICD-10-CM | POA: Diagnosis present

## 2016-06-22 DIAGNOSIS — Z880 Allergy status to penicillin: Secondary | ICD-10-CM | POA: Diagnosis not present

## 2016-06-22 DIAGNOSIS — Z3A18 18 weeks gestation of pregnancy: Secondary | ICD-10-CM | POA: Diagnosis not present

## 2016-06-22 NOTE — MAU Provider Note (Signed)
History   Patient Shirley Hall is a 21 year olf G2P0010 at 18 weeks and 2 days here with complaints of pinkish discharge with a little bit of stringy brown Discharge. This happened an hour ago and she looked online and got scared and came in to get checked out. Patient is very anxious because she had a miscarriage last year and is afraid that she is miscarrying. She also thinks she has a yeast infection.   Patient was seen three days ago on 06-17-2016 and diagnosed with BV; she has just started taking her flagyl.  She denies any pain with urination, active bleeding, abdominal pain or discharge with heavy odor.  Her GC CT cultures were negative from her 06-17-16 visit.  CSN: [id number]  Arrival date and time: 06/22/16 0309   None     Chief Complaint  Patient presents with  . Vaginal Discharge   Vaginal Discharge  The patient's primary symptoms include vaginal discharge. This is a new problem. The current episode started in the past 7 days. The problem occurs intermittently. Progression since onset: was pink and now is a little brown and stringy. The patient is experiencing no pain. The vaginal discharge was bloody. The vaginal bleeding is spotting. She has not been passing clots. She has not been passing tissue. Nothing aggravates the symptoms. She has tried nothing for the symptoms.    OB History    Gravida Para Term Preterm AB Living   2       1     SAB TAB Ectopic Multiple Live Births   1              Past Medical History:  Diagnosis Date  . Anemia     Past Surgical History:  Procedure Laterality Date  . NO PAST SURGERIES      History reviewed. No pertinent family history.  Social History  Substance Use Topics  . Smoking status: Never Smoker  . Smokeless tobacco: Never Used  . Alcohol use No    Allergies:  Allergies  Allergen Reactions  . Amoxicillin Other (See Comments)    States father advised her family history of allergy. Pt has never had this med      Prescriptions Prior to Admission  Medication Sig Dispense Refill Last Dose  . metroNIDAZOLE (FLAGYL) 500 MG tablet Take 1 tablet (500 mg total) by mouth 2 (two) times daily. 14 tablet 0 06/21/2016 at Unknown time  . Prenatal Vit-Fe Fumarate-FA (PRENATAL MULTIVITAMIN) TABS tablet Take 1 tablet by mouth daily at 12 noon.   06/21/2016 at Unknown time    Review of Systems  Constitutional: Negative.   Eyes: Negative.   Respiratory: Negative.   Endocrine: Negative.   Genitourinary: Positive for vaginal discharge.  Neurological: Negative.   Hematological: Negative.    Physical Exam   Blood pressure 103/61, pulse 83, temperature 98.9 F (37.2 C), temperature source Oral, resp. rate 20, height 5\' 5"  (1.651 m), weight 55.5 kg (122 lb 4 oz), last menstrual period 02/15/2016, unknown if currently breastfeeding.  Physical Exam  Constitutional: She is oriented to person, place, and time. She appears well-developed.  HENT:  Head: Normocephalic.  Neck: Normal range of motion.  Respiratory: Effort normal.  GI: Soft.  Genitourinary:  Genitourinary Comments: NEFG; no lesions on vaginal walls. No lesions on cervix, no blood in the vagina and no discharge in the vagina. No CMT, closed, long and thick, no suprapubic tenderness.   Musculoskeletal: Normal range of motion.  Neurological: She is  alert and oriented to person, place, and time.  Skin: Skin is warm and dry.    MAU Course  Procedures  MDM -no evidence of active bleeding, no cramping. FHR doppler at 153  Assessment and Plan   1. Vaginal bleeding before [redacted] weeks gestation    2. Patient stable for discharge; patient plans to keep her appt at [facility] on 06-23-2016.  3. Reassured and support given. Reviewed when to return to the MAU (bleeding, abdominal pain)  Charlesetta Garibaldi Edith Lord CNM 06/22/2016, 3:46 AM

## 2016-06-22 NOTE — MAU Note (Signed)
PT  SAYS WHEN SHE WAS HERE      3-23- TOLD HAD BV     -SINCE THEN  HAS BEEN TIRED  AND  NAUSEA -  FELT LIKE SHE ALMOST PASSED OUT.    TONIGHT WENT TO  B-ROOM- PINK D/C  AND BROWN STRINGY D/C -  HAS VAG ITCHING- STARTED    YESTERDAY  . PNC  WITH-   FAMINA.  LAST SEX-    THURSDAY

## 2016-06-22 NOTE — Discharge Instructions (Signed)

## 2016-06-23 ENCOUNTER — Encounter: Payer: Self-pay | Admitting: Certified Nurse Midwife

## 2016-06-23 ENCOUNTER — Ambulatory Visit (INDEPENDENT_AMBULATORY_CARE_PROVIDER_SITE_OTHER): Payer: Medicaid Other | Admitting: Certified Nurse Midwife

## 2016-06-23 VITALS — BP 105/66 | HR 81 | Wt 124.0 lb

## 2016-06-23 DIAGNOSIS — B3731 Acute candidiasis of vulva and vagina: Secondary | ICD-10-CM

## 2016-06-23 DIAGNOSIS — Z3482 Encounter for supervision of other normal pregnancy, second trimester: Secondary | ICD-10-CM

## 2016-06-23 DIAGNOSIS — Z3480 Encounter for supervision of other normal pregnancy, unspecified trimester: Secondary | ICD-10-CM

## 2016-06-23 DIAGNOSIS — B373 Candidiasis of vulva and vagina: Secondary | ICD-10-CM

## 2016-06-23 MED ORDER — TERCONAZOLE 0.8 % VA CREA: 1.0000 | TOPICAL_CREAM | Freq: Every day | VAGINAL | 0 refills | Status: DC

## 2016-06-23 MED ORDER — FLUCONAZOLE 150 MG PO TABS: 150.0000 mg | ORAL_TABLET | Freq: Once | ORAL | 0 refills | Status: AC

## 2016-06-23 NOTE — Progress Notes (Signed)
Pt presents for ROB.Pt declines AFP today. Anatomy due.

## 2016-06-23 NOTE — Progress Notes (Signed)
   PRENATAL VISIT NOTE  Subjective:  Shirley Hall is a 21 y.o. G2P0010 at 661w3d being seen today for ongoing prenatal care.  She is currently monitored for the following issues for this low-risk pregnancy and has Supervision of normal pregnancy, antepartum on her problem list.  Patient reports no complaints.  Contractions: Not present. Vag. Bleeding: None.  Movement: Absent. Denies leaking of fluid.   The following portions of the patient's history were reviewed and updated as appropriate: allergies, current medications, past family history, past medical history, past social history, past surgical history and problem list. Problem list updated.  Objective:   Vitals:   06/23/16 1335  BP: 105/66  Pulse: 81  Weight: 124 lb (56.2 kg)    Fetal Status: Fetal Heart Rate (bpm): 135 Fundal Height: 19 cm Movement: Absent     General:  Alert, oriented and cooperative. Patient is in no acute distress.  Skin: Skin is warm and dry. No rash noted.   Cardiovascular: Normal heart rate noted  Respiratory: Normal respiratory effort, no problems with respiration noted  Abdomen: Soft, gravid, appropriate for gestational age. Pain/Pressure: Absent     Pelvic:  Cervical exam deferred        Extremities: Normal range of motion.  Edema: None  Mental Status: Normal mood and affect. Normal behavior. Normal judgment and thought content.   Assessment and Plan:  Pregnancy: G2P0010 at 7361w3d  1. Supervision of other normal pregnancy, antepartum     Doing well.  No other vaginal spotting since 06/22/16.   - US MFM OB COMP + 14 WK; Future  2. Yeast infection of the vagina      - fluconazole (DIFLUCAN) 150 MG tablet; Take 1 tablet (150 mg total) by mouth once.  Dispense: 1 tablet; Refill: 0 - terconazole (TERAZOL 3) 0.8 % vaginal cream; Place 1 applicator vaginally at bedtime.  Dispense: 20 g; Refill: 0  Preterm labor symptoms and general obstetric precautions including but not limited to vaginal bleeding,  contractions, leaking of fluid and fetal movement were reviewed in detail with the patient. Please refer to After Visit Summary for other counseling recommendations.  Return in about 4 weeks (around 07/21/2016) for ROB.   Roe Coombsachelle A Ivett Luebbe, CNM

## 2016-06-24 ENCOUNTER — Emergency Department (HOSPITAL_COMMUNITY)
Admission: EM | Admit: 2016-06-24 | Discharge: 2016-06-25 | Disposition: A | Discharge status: Home / Self Care | Payer: Medicaid Other | Attending: Emergency Medicine | Admitting: Emergency Medicine

## 2016-06-24 ENCOUNTER — Encounter (HOSPITAL_COMMUNITY): Payer: Self-pay | Admitting: Emergency Medicine

## 2016-06-24 ENCOUNTER — Emergency Department (HOSPITAL_COMMUNITY): Payer: Medicaid Other

## 2016-06-24 DIAGNOSIS — Z79899 Other long term (current) drug therapy: Secondary | ICD-10-CM | POA: Insufficient documentation

## 2016-06-24 DIAGNOSIS — O208 Other hemorrhage in early pregnancy: Secondary | ICD-10-CM | POA: Insufficient documentation

## 2016-06-24 DIAGNOSIS — O4690 Antepartum hemorrhage, unspecified, unspecified trimester: Secondary | ICD-10-CM

## 2016-06-24 DIAGNOSIS — Z3A18 18 weeks gestation of pregnancy: Secondary | ICD-10-CM | POA: Insufficient documentation

## 2016-06-24 DIAGNOSIS — N898 Other specified noninflammatory disorders of vagina: Secondary | ICD-10-CM

## 2016-06-24 HISTORY — DX: Encounter for supervision of normal pregnancy, unspecified, unspecified trimester: Z34.90

## 2016-06-24 NOTE — ED Triage Notes (Signed)
C/o "reddish- orange" vaginal bleeding since 2am this morning.  Denies pain.  States she is [redacted] weeks pregnant.  Currently being treated for a yeast infection and BV.  Seen at [facility] 2-3 days ago.

## 2016-06-25 NOTE — Discharge Instructions (Signed)
1. Medications: finish Flagyl, usual home medications 2. Treatment: rest, drink plenty of fluids,  3. Follow Up: Please followup with your OB/GYN at your next scheduled appointment or sooner if concerns arise for discussion of your diagnoses and further evaluation after today's visit; if you do not have a primary care doctor use the resource guide provided to find one; Please return to the MAU for frank vaginal bleeding, abdominal pain or other concerns

## 2016-06-25 NOTE — ED Provider Notes (Signed)
[facility]-EMERGENCY DEPT Provider Note   CSN: [id number] Arrival date & time: 06/24/16  2249     History   Chief Complaint Chief Complaint  Patient presents with  . Threatened Miscarriage    HPI Shirley Hall is a 21 y.o. female G2P0010 with a hx of anemia presents to the Emergency Department complaining of orange discharge noted in her underwear.  Pt was evaluated for vaginal discharge and lower abd pain on 06/17/16, dx with BV and treated with Flagyl and monostat.  Record review shows pt has been evaluated several times in the [facility] and the clinic For vaginal discharge and vaginal bleeding. Patient was evaluated again on 06/22/2016 for same.  Ultrasound reassuring at that time. Associated symptoms include pink discharge.  Nothing makes it better and nothing makes it worse.  Pt denies fever, chills, abd pain, dysuria, hematuria, frank bleeding.    OB - [facility]   The history is provided by the patient and medical records. No language interpreter was used.    Past Medical History:  Diagnosis Date  . Anemia   . Pregnant     Patient Active Problem List   Diagnosis Date Noted  . Supervision of normal pregnancy, antepartum 04/28/2016    Past Surgical History:  Procedure Laterality Date  . NO PAST SURGERIES      OB History    Gravida Para Term Preterm AB Living   2       1     SAB TAB Ectopic Multiple Live Births   1               Home Medications    Prior to Admission medications   Medication Sig Start Date End Date Taking? Authorizing Provider  metroNIDAZOLE (FLAGYL) 500 MG tablet Take 1 tablet (500 mg total) by mouth 2 (two) times daily. 06/17/16   Judeth Horn, NP  Prenatal Vit-Fe Fumarate-FA (PRENATAL MULTIVITAMIN) TABS tablet Take 1 tablet by mouth daily at 12 noon.    Historical Provider, MD  terconazole (TERAZOL 3) 0.8 % vaginal cream Place 1 applicator vaginally at bedtime. 06/23/16   Roe Coombs, CNM    Family History No family history on  file.  Social History Social History  Substance Use Topics  . Smoking status: Never Smoker  . Smokeless tobacco: Never Used  . Alcohol use No     Allergies   Amoxicillin   Review of Systems Review of Systems  Genitourinary: Positive for vaginal discharge.  All other systems reviewed and are negative.    Physical Exam Updated Vital Signs BP 100/66   Pulse 94   Temp 98.2 F (36.8 C) (Oral)   Resp 20   Ht  (1.651 m)   Wt 55.3 kg   LMP 02/15/2016   SpO2 100%   BMI 20.30 kg/m   Physical Exam  Constitutional: She appears well-developed and well-nourished. No distress.  HENT:  Head: Normocephalic and atraumatic.  Eyes: Conjunctivae are normal.  Neck: Normal range of motion.  Cardiovascular: Normal rate, regular rhythm, normal heart sounds and intact distal pulses.   No murmur heard. Pulmonary/Chest: Effort normal and breath sounds normal. No respiratory distress. She has no wheezes.  Abdominal: Soft. Bowel sounds are normal. There is no tenderness. There is no rebound and no guarding. Hernia confirmed negative in the right inguinal area and confirmed negative in the left inguinal area.  Gravid uterus palpable  Genitourinary: Uterus normal. No labial fusion. There is no rash, tenderness or lesion on  the right labia. There is no rash, tenderness or lesion on the left labia. Uterus is not deviated, not enlarged, not fixed and not tender. Cervix exhibits friability. Cervix exhibits no motion tenderness and no discharge. Right adnexum displays no mass, no tenderness and no fullness. Left adnexum displays no mass, no tenderness and no fullness. No erythema, tenderness or bleeding in the vagina. No foreign body in the vagina. No signs of injury around the vagina. Vaginal discharge (Very light pink, moderate amount) found.  Genitourinary Comments: Cervix is somewhat friable Vaginal walls appear irritated  Musculoskeletal: Normal range of motion. She exhibits no edema.    Lymphadenopathy:       Right: No inguinal adenopathy present.       Left: No inguinal adenopathy present.  Neurological: She is alert.  Skin: Skin is warm and dry. She is not diaphoretic. No erythema.  Psychiatric: She has a normal mood and affect.  Nursing note and vitals reviewed.    ED Treatments / Results   Radiology US Ob Limited  Result Date: 06/25/2016 CLINICAL DATA:  Acute onset of vaginal bleeding.  Initial encounter. EXAM: LIMITED OBSTETRIC ULTRASOUND FINDINGS: Number of Fetuses: 1 Heart Rate:  137 bpm Movement: Yes Presentation: Cephalic Placental Location: Posterior Previa: No Amniotic Fluid (Subjective):  Within normal limits. BPD:  2.8 cm       18 w 5 d MATERNAL FINDINGS: Cervix:  Appears closed. Uterus/Adnexae:  No abnormality visualized. IMPRESSION: Single live intrauterine pregnancy noted, with a biparietal diameter of 2.8 cm, corresponding to a gestational age of [redacted] weeks 5 days. This matches the gestational age by LMP of 18 weeks 4 days, reflecting an estimated date of delivery of November 21, 2016. No evidence of placenta previa. The cervix remains closed. This exam is performed on an emergent basis and does not comprehensively evaluate fetal size, dating, or anatomy; follow-up complete OB US should be considered if further fetal assessment is warranted. Electronically Signed   By: Roanna Raider M.D.   On: 06/25/2016 00:51    Procedures Procedures (including critical care time)  Medications Ordered in ED Medications - No data to display   Initial Impression / Assessment and Plan / ED Course  I have reviewed the triage vital signs and the nursing notes.  Pertinent labs & imaging results that were available during my care of the patient were reviewed by me and considered in my medical decision making (see chart for details).     Patient reports vaginal discharge different today from previous as it is now described as orange. No abdominal pain or frank bleeding.  Ultrasound shows a single live IUP at 18 weeks and 5 days with a heart rate of 137. Pelvic exam with cervical friability and small amount of discharge. No cervical motion tenderness, adnexal tenderness. Gravid uterus palpable.  Exam and ultrasound are reassuring. Please follow-up with her OB/GYN at her next appointment in 2 weeks.  Final Clinical Impressions(s) / ED Diagnoses   Final diagnoses:  Vaginal bleeding in pregnancy  Vaginal discharge    New Prescriptions Discharge Medication List as of 06/25/2016  1:30 AM       Dierdre Forth, PA-C 06/25/16 4696    Gilda Crease, MD 06/27/16 2312

## 2016-07-04 ENCOUNTER — Ambulatory Visit (HOSPITAL_COMMUNITY)
Admission: RE | Admit: 2016-07-04 | Discharge: 2016-07-04 | Disposition: A | Discharge status: Home / Self Care | Payer: Medicaid Other | Source: Ambulatory Visit | Attending: Certified Nurse Midwife | Admitting: Certified Nurse Midwife

## 2016-07-04 DIAGNOSIS — Z3480 Encounter for supervision of other normal pregnancy, unspecified trimester: Secondary | ICD-10-CM

## 2016-07-04 DIAGNOSIS — Z3689 Encounter for other specified antenatal screening: Secondary | ICD-10-CM | POA: Insufficient documentation

## 2016-07-04 DIAGNOSIS — Z3A20 20 weeks gestation of pregnancy: Secondary | ICD-10-CM | POA: Insufficient documentation

## 2016-07-06 ENCOUNTER — Other Ambulatory Visit: Payer: Self-pay | Admitting: Certified Nurse Midwife

## 2016-07-06 DIAGNOSIS — Z3480 Encounter for supervision of other normal pregnancy, unspecified trimester: Secondary | ICD-10-CM

## 2016-07-21 ENCOUNTER — Ambulatory Visit (INDEPENDENT_AMBULATORY_CARE_PROVIDER_SITE_OTHER): Payer: Medicaid Other | Admitting: Obstetrics and Gynecology

## 2016-07-21 VITALS — BP 99/64 | HR 76 | Wt 129.9 lb

## 2016-07-21 DIAGNOSIS — Z3482 Encounter for supervision of other normal pregnancy, second trimester: Secondary | ICD-10-CM

## 2016-07-21 DIAGNOSIS — Z3480 Encounter for supervision of other normal pregnancy, unspecified trimester: Secondary | ICD-10-CM

## 2016-07-21 NOTE — Progress Notes (Signed)
Subjective:  Shirley Hall is a 21 y.o. G2P0010 at [redacted]w[redacted]d being seen today for ongoing prenatal care.  She is currently monitored for the following issues for this low-risk pregnancy and has Supervision of normal pregnancy, antepartum on her problem list.  Patient reports no complaints.  Contractions: Not present. Vag. Bleeding: None.  Movement: Present. Denies leaking of fluid.   The following portions of the patient's history were reviewed and updated as appropriate: allergies, current medications, past family history, past medical history, past social history, past surgical history and problem list. Problem list updated.  Objective:   Vitals:   07/21/16 1306  BP: 99/64  Pulse: 76  Weight: 129 lb 14.4 oz (58.9 kg)    Fetal Status: Fetal Heart Rate (bpm): 138   Movement: Present     General:  Alert, oriented and cooperative. Patient is in no acute distress.  Skin: Skin is warm and dry. No rash noted.   Cardiovascular: Normal heart rate noted  Respiratory: Normal respiratory effort, no problems with respiration noted  Abdomen: Soft, gravid, appropriate for gestational age. Pain/Pressure: Absent     Pelvic:  Cervical exam deferred        Extremities: Normal range of motion.  Edema: None  Mental Status: Normal mood and affect. Normal behavior. Normal judgment and thought content.   Urinalysis:      Assessment and Plan:  Pregnancy: G2P0010 at [redacted]w[redacted]d  1. Supervision of other normal pregnancy, antepartum No problems Glucola next visit  Preterm labor symptoms and general obstetric precautions including but not limited to vaginal bleeding, contractions, leaking of fluid and fetal movement were reviewed in detail with the patient. Please refer to After Visit Summary for other counseling recommendations.  Return in about 4 weeks (around 08/18/2016) for OB visit.   Hermina Staggers, MD

## 2016-07-21 NOTE — Patient Instructions (Signed)

## 2016-07-21 NOTE — Progress Notes (Signed)
Patient reports good fetal movement, denies pain. 

## 2016-08-18 ENCOUNTER — Other Ambulatory Visit: Payer: Medicaid Other

## 2016-08-18 ENCOUNTER — Ambulatory Visit (INDEPENDENT_AMBULATORY_CARE_PROVIDER_SITE_OTHER): Payer: Medicaid Other | Admitting: Obstetrics & Gynecology

## 2016-08-18 VITALS — BP 101/69 | HR 85 | Wt 136.0 lb

## 2016-08-18 DIAGNOSIS — Z3482 Encounter for supervision of other normal pregnancy, second trimester: Secondary | ICD-10-CM

## 2016-08-18 DIAGNOSIS — Z3480 Encounter for supervision of other normal pregnancy, unspecified trimester: Secondary | ICD-10-CM

## 2016-08-18 DIAGNOSIS — Z3493 Encounter for supervision of normal pregnancy, unspecified, third trimester: Secondary | ICD-10-CM

## 2016-08-18 NOTE — Patient Instructions (Signed)
Third Trimester of Pregnancy The third trimester is from week 28 through week 40 (months 7 through 9). The third trimester is a time when the unborn baby (fetus) is growing rapidly. At the end of the ninth month, the fetus is about 20 inches in length and weighs 6-10 pounds. Body changes during your third trimester Your body will continue to go through many changes during pregnancy. The changes vary from woman to woman. During the third trimester:  Your weight will continue to increase. You can expect to gain 25-35 pounds (11-16 kg) by the end of the pregnancy.  You may begin to get stretch marks on your hips, abdomen, and breasts.  You may urinate more often because the fetus is moving lower into your pelvis and pressing on your bladder.  You may develop or continue to have heartburn. This is caused by increased hormones that slow down muscles in the digestive tract.  You may develop or continue to have constipation because increased hormones slow digestion and cause the muscles that push waste through your intestines to relax.  You may develop hemorrhoids. These are swollen veins (varicose veins) in the rectum that can itch or be painful.  You may develop swollen, bulging veins (varicose veins) in your legs.  You may have increased body aches in the pelvis, back, or thighs. This is due to weight gain and increased hormones that are relaxing your joints.  You may have changes in your hair. These can include thickening of your hair, rapid growth, and changes in texture. Some women also have hair loss during or after pregnancy, or hair that feels dry or thin. Your hair will most likely return to normal after your baby is born.  Your breasts will continue to grow and they will continue to become tender. A yellow fluid (colostrum) may leak from your breasts. This is the first milk you are producing for your baby.  Your belly button may stick out.  You may notice more swelling in your hands,  face, or ankles.  You may have increased tingling or numbness in your hands, arms, and legs. The skin on your belly may also feel numb.  You may feel short of breath because of your expanding uterus.  You may have more problems sleeping. This can be caused by the size of your belly, increased need to urinate, and an increase in your body's metabolism.  You may notice the fetus "dropping," or moving lower in your abdomen (lightening).  You may have increased vaginal discharge.  You may notice your joints feel loose and you may have pain around your pelvic bone.  What to expect at prenatal visits You will have prenatal exams every 2 weeks until week 36. Then you will have weekly prenatal exams. During a routine prenatal visit:  You will be weighed to make sure you and the baby are growing normally.  Your blood pressure will be taken.  Your abdomen will be measured to track your baby's growth.  The fetal heartbeat will be listened to.  Any test results from the previous visit will be discussed.  You may have a cervical check near your due date to see if your cervix has softened or thinned (effaced).  You will be tested for Group B streptococcus. This happens between 35 and 37 weeks.  Your health care provider may ask you:  What your birth plan is.  How you are feeling.  If you are feeling the baby move.  If you have had   any abnormal symptoms, such as leaking fluid, bleeding, severe headaches, or abdominal cramping.  If you are using any tobacco products, including cigarettes, chewing tobacco, and electronic cigarettes.  If you have any questions.  Other tests or screenings that may be performed during your third trimester include:  Blood tests that check for low iron levels (anemia).  Fetal testing to check the health, activity level, and growth of the fetus. Testing is done if you have certain medical conditions or if there are problems during the  pregnancy.  Nonstress test (NST). This test checks the health of your baby to make sure there are no signs of problems, such as the baby not getting enough oxygen. During this test, a belt is placed around your belly. The baby is made to move, and its heart rate is monitored during movement.  What is false labor? False labor is a condition in which you feel small, irregular tightenings of the muscles in the womb (contractions) that usually go away with rest, changing position, or drinking water. These are called Braxton Hicks contractions. Contractions may last for hours, days, or even weeks before true labor sets in. If contractions come at regular intervals, become more frequent, increase in intensity, or become painful, you should see your health care provider. What are the signs of labor?  Abdominal cramps.  Regular contractions that start at 10 minutes apart and become stronger and more frequent with time.  Contractions that start on the top of the uterus and spread down to the lower abdomen and back.  Increased pelvic pressure and dull back pain.  A watery or bloody mucus discharge that comes from the vagina.  Leaking of amniotic fluid. This is also known as your "water breaking." It could be a slow trickle or a gush. Let your health care provider know if it has a color or strange odor. If you have any of these signs, call your health care provider right away, even if it is before your due date. Follow these instructions at home: Medicines  Follow your health care provider's instructions regarding medicine use. Specific medicines may be either safe or unsafe to take during pregnancy.  Take a prenatal vitamin that contains at least 600 micrograms (mcg) of folic acid.  If you develop constipation, try taking a stool softener if your health care provider approves. Eating and drinking  Eat a balanced diet that includes fresh fruits and vegetables, whole grains, good sources of protein  such as meat, eggs, or tofu, and low-fat dairy. Your health care provider will help you determine the amount of weight gain that is right for you.  Avoid raw meat and uncooked cheese. These carry germs that can cause birth defects in the baby.  If you have low calcium intake from food, talk to your health care provider about whether you should take a daily calcium supplement.  Eat four or five small meals rather than three large meals a day.  Limit foods that are high in fat and processed sugars, such as fried and sweet foods.  To prevent constipation: ? Drink enough fluid to keep your urine clear or pale yellow. ? Eat foods that are high in fiber, such as fresh fruits and vegetables, whole grains, and beans. Activity  Exercise only as directed by your health care provider. Most women can continue their usual exercise routine during pregnancy. Try to exercise for 30 minutes at least 5 days a week. Stop exercising if you experience uterine contractions.  Avoid heavy   lifting.  Do not exercise in extreme heat or humidity, or at high altitudes.  Wear low-heel, comfortable shoes.  Practice good posture.  You may continue to have sex unless your health care provider tells you otherwise. Relieving pain and discomfort  Take frequent breaks and rest with your legs elevated if you have leg cramps or low back pain.  Take warm sitz baths to soothe any pain or discomfort caused by hemorrhoids. Use hemorrhoid cream if your health care provider approves.  Wear a good support bra to prevent discomfort from breast tenderness.  If you develop varicose veins: ? Wear support pantyhose or compression stockings as told by your healthcare provider. ? Elevate your feet for 15 minutes, 3-4 times a day. Prenatal care  Write down your questions. Take them to your prenatal visits.  Keep all your prenatal visits as told by your health care provider. This is important. Safety  Wear your seat belt at  all times when driving.  Make a list of emergency phone numbers, including numbers for family, friends, the hospital, and police and fire departments. General instructions  Avoid cat litter boxes and soil used by cats. These carry germs that can cause birth defects in the baby. If you have a cat, ask someone to clean the litter box for you.  Do not travel far distances unless it is absolutely necessary and only with the approval of your health care provider.  Do not use hot tubs, steam rooms, or saunas.  Do not drink alcohol.  Do not use any products that contain nicotine or tobacco, such as cigarettes and e-cigarettes. If you need help quitting, ask your health care provider.  Do not use any medicinal herbs or unprescribed drugs. These chemicals affect the formation and growth of the baby.  Do not douche or use tampons or scented sanitary pads.  Do not cross your legs for long periods of time.  To prepare for the arrival of your baby: ? Take prenatal classes to understand, practice, and ask questions about labor and delivery. ? Make a trial run to the hospital. ? Visit the hospital and tour the maternity area. ? Arrange for maternity or paternity leave through employers. ? Arrange for family and friends to take care of pets while you are in the hospital. ? Purchase a rear-facing car seat and make sure you know how to install it in your car. ? Pack your hospital bag. ? Prepare the baby's nursery. Make sure to remove all pillows and stuffed animals from the baby's crib to prevent suffocation.  Visit your dentist if you have not gone during your pregnancy. Use a soft toothbrush to brush your teeth and be gentle when you floss. Contact a health care provider if:  You are unsure if you are in labor or if your water has broken.  You become dizzy.  You have mild pelvic cramps, pelvic pressure, or nagging pain in your abdominal area.  You have lower back pain.  You have persistent  nausea, vomiting, or diarrhea.  You have an unusual or bad smelling vaginal discharge.  You have pain when you urinate. Get help right away if:  Your water breaks before 37 weeks.  You have regular contractions less than 5 minutes apart before 37 weeks.  You have a fever.  You are leaking fluid from your vagina.  You have spotting or bleeding from your vagina.  You have severe abdominal pain or cramping.  You have rapid weight loss or weight gain.    You have shortness of breath with chest pain.  You notice sudden or extreme swelling of your face, hands, ankles, feet, or legs.  Your baby makes fewer than 10 movements in 2 hours.  You have severe headaches that do not go away when you take medicine.  You have vision changes. Summary  The third trimester is from week 28 through week 40, months 7 through 9. The third trimester is a time when the unborn baby (fetus) is growing rapidly.  During the third trimester, your discomfort may increase as you and your baby continue to gain weight. You may have abdominal, leg, and back pain, sleeping problems, and an increased need to urinate.  During the third trimester your breasts will keep growing and they will continue to become tender. A yellow fluid (colostrum) may leak from your breasts. This is the first milk you are producing for your baby.  False labor is a condition in which you feel small, irregular tightenings of the muscles in the womb (contractions) that eventually go away. These are called Braxton Hicks contractions. Contractions may last for hours, days, or even weeks before true labor sets in.  Signs of labor can include: abdominal cramps; regular contractions that start at 10 minutes apart and become stronger and more frequent with time; watery or bloody mucus discharge that comes from the vagina; increased pelvic pressure and dull back pain; and leaking of amniotic fluid. This information is not intended to replace advice  given to you by your health care provider. Make sure you discuss any questions you have with your health care provider. Document Released: 03/08/2001 Document Revised: 08/20/2015 Document Reviewed: 05/15/2012 Elsevier Interactive Patient Education  2017 [employer]  

## 2016-08-18 NOTE — Progress Notes (Signed)
Patient complains of pains under her right breast.

## 2016-08-18 NOTE — Progress Notes (Signed)
   PRENATAL VISIT NOTE  Subjective:  Shirley Hall is a 21 y.o. G2P0010 at 4737w3d being seen today for ongoing prenatal care.  She is currently monitored for the following issues for this low-risk pregnancy and has Supervision of normal pregnancy, antepartum on her problem list.  Patient reports no complaints.  Contractions: Not present. Vag. Bleeding: None.  Movement: Present. Denies leaking of fluid.   The following portions of the patient's history were reviewed and updated as appropriate: allergies, current medications, past family history, past medical history, past social history, past surgical history and problem list. Problem list updated.  Objective:   Vitals:   08/18/16 0818  BP: 101/69  Pulse: 85  Weight: 136 lb (61.7 kg)    Fetal Status: Fetal Heart Rate (bpm): 144   Movement: Present     General:  Alert, oriented and cooperative. Patient is in no acute distress.  Skin: Skin is warm and dry. No rash noted.   Cardiovascular: Normal heart rate noted  Respiratory: Normal respiratory effort, no problems with respiration noted  Abdomen: Soft, gravid, appropriate for gestational age. Pain/Pressure: Absent     Pelvic:  Cervical exam deferred        Extremities: Normal range of motion.  Edema: Trace  Mental Status: Normal mood and affect. Normal behavior. Normal judgment and thought content.   Assessment and Plan:  Pregnancy: G2P0010 at 6237w3d  1. Supervision of other normal pregnancy, antepartum  - Glucose Tolerance, 2 Hours w/1 Hour - CBC - HIV antibody (with reflex) - RPR  Preterm labor symptoms and general obstetric precautions including but not limited to vaginal bleeding, contractions, leaking of fluid and fetal movement were reviewed in detail with the patient. Please refer to After Visit Summary for other counseling recommendations.  Return in about 3 weeks (around 09/08/2016).   Scheryl DarterJames Real Cona, MD

## 2016-08-19 LAB — GLUCOSE TOLERANCE, 2 HOURS W/ 1HR
GLUCOSE, 2 HOUR: 99 mg/dL (ref 65–152)
Glucose, 1 hour: 118 mg/dL (ref 65–179)
Glucose, Fasting: 73 mg/dL (ref 65–91)

## 2016-08-19 LAB — CBC
HEMOGLOBIN: 9.5 g/dL — AB (ref 11.1–15.9)
Hematocrit: 29.8 % — ABNORMAL LOW (ref 34.0–46.6)
MCH: 28.4 pg (ref 26.6–33.0)
MCHC: 31.9 g/dL (ref 31.5–35.7)
MCV: 89 fL (ref 79–97)
Platelets: 350 10*3/uL (ref 150–379)
RBC: 3.34 x10E6/uL — ABNORMAL LOW (ref 3.77–5.28)
RDW: 13.8 % (ref 12.3–15.4)
WBC: 10 10*3/uL (ref 3.4–10.8)

## 2016-08-19 LAB — HIV ANTIBODY (ROUTINE TESTING W REFLEX): HIV Screen 4th Generation wRfx: NONREACTIVE

## 2016-08-19 LAB — RPR: RPR: NONREACTIVE

## 2016-09-08 ENCOUNTER — Ambulatory Visit (INDEPENDENT_AMBULATORY_CARE_PROVIDER_SITE_OTHER): Payer: Medicaid Other | Admitting: Obstetrics and Gynecology

## 2016-09-08 VITALS — BP 104/69 | HR 77 | Wt 140.0 lb

## 2016-09-08 DIAGNOSIS — Z3482 Encounter for supervision of other normal pregnancy, second trimester: Secondary | ICD-10-CM

## 2016-09-08 DIAGNOSIS — Z3480 Encounter for supervision of other normal pregnancy, unspecified trimester: Secondary | ICD-10-CM

## 2016-09-08 NOTE — Progress Notes (Signed)
   PRENATAL VISIT NOTE  Subjective:  Shirley Hall is a 21 y.o. G2P0010 at 5270w3d being seen today for ongoing prenatal care.  She is currently monitored for the following issues for this low-risk pregnancy and has Supervision of normal pregnancy, antepartum on her problem list.  Patient reports no complaints.  Contractions: Irritability. Vag. Bleeding: None.  Movement: Present. Denies leaking of fluid.   The following portions of the patient's history were reviewed and updated as appropriate: allergies, current medications, past family history, past medical history, past social history, past surgical history and problem list. Problem list updated.  Objective:   Vitals:   09/08/16 0849  BP: 104/69  Pulse: 77  Weight: 140 lb (63.5 kg)    Fetal Status: Fetal Heart Rate (bpm): 140 Fundal Height: 30 cm Movement: Present     General:  Alert, oriented and cooperative. Patient is in no acute distress.  Skin: Skin is warm and dry. No rash noted.   Cardiovascular: Normal heart rate noted  Respiratory: Normal respiratory effort, no problems with respiration noted  Abdomen: Soft, gravid, appropriate for gestational age. Pain/Pressure: Present Pain under right breast reproducible to touch and overlying lower ribs    Pelvic:  Cervical exam deferred        Extremities: Normal range of motion.     Mental Status: Normal mood and affect. Normal behavior. Normal judgment and thought content.   Assessment and Plan:  Pregnancy: G2P0010 at 1570w3d  1. Supervision of other normal pregnancy, antepartum Patient doing well Discussed nature of her pain likely musculoskeletal. Discussed stretching exercises and tylenol for discomfort Patient undecided on pediatrician and contraception Glucola and lab results reviewed with the patient  Preterm labor symptoms and general obstetric precautions including but not limited to vaginal bleeding, contractions, leaking of fluid and fetal movement were reviewed in  detail with the patient. Please refer to After Visit Summary for other counseling recommendations.  Return in about 2 weeks (around 09/22/2016) for ROB.   Catalina AntiguaPeggy Corneshia Hines, MD

## 2016-09-08 NOTE — Patient Instructions (Signed)
Contraception Choices Contraception (birth control) is the use of any methods or devices to prevent pregnancy. Below are some methods to help avoid pregnancy. Hormonal methods  Contraceptive implant. This is a thin, plastic tube containing progesterone hormone. It does not contain estrogen hormone. Your health care provider inserts the tube in the inner part of the upper arm. The tube can remain in place for up to 3 years. After 3 years, the implant must be removed. The implant prevents the ovaries from releasing an egg (ovulation), thickens the cervical mucus to prevent sperm from entering the uterus, and thins the lining of the inside of the uterus.  Progesterone-only injections. These injections are given every 3 months by your health care provider to prevent pregnancy. This synthetic progesterone hormone stops the ovaries from releasing eggs. It also thickens cervical mucus and changes the uterine lining. This makes it harder for sperm to survive in the uterus.  Birth control pills. These pills contain estrogen and progesterone hormone. They work by preventing the ovaries from releasing eggs (ovulation). They also cause the cervical mucus to thicken, preventing the sperm from entering the uterus. Birth control pills are prescribed by a health care provider.Birth control pills can also be used to treat heavy periods.  Minipill. This type of birth control pill contains only the progesterone hormone. They are taken every day of each month and must be prescribed by your health care provider.  Birth control patch. The patch contains hormones similar to those in birth control pills. It must be changed once a week and is prescribed by a health care provider.  Vaginal ring. The ring contains hormones similar to those in birth control pills. It is left in the vagina for 3 weeks, removed for 1 week, and then a new one is put back in place. The patient must be comfortable inserting and removing the ring from  the vagina.A health care provider's prescription is necessary.  Emergency contraception. Emergency contraceptives prevent pregnancy after unprotected sexual intercourse. This pill can be taken right after sex or up to 5 days after unprotected sex. It is most effective the sooner you take the pills after having sexual intercourse. Most emergency contraceptive pills are available without a prescription. Check with your pharmacist. Do not use emergency contraception as your only form of birth control. Barrier methods  Female condom. This is a thin sheath (latex or rubber) that is worn over the penis during sexual intercourse. It can be used with spermicide to increase effectiveness.  Female condom. This is a soft, loose-fitting sheath that is put into the vagina before sexual intercourse.  Diaphragm. This is a soft, latex, dome-shaped barrier that must be fitted by a health care provider. It is inserted into the vagina, along with a spermicidal jelly. It is inserted before intercourse. The diaphragm should be left in the vagina for 6 to 8 hours after intercourse.  Cervical cap. This is a round, soft, latex or plastic cup that fits over the cervix and must be fitted by a health care provider. The cap can be left in place for up to 48 hours after intercourse.  Sponge. This is a soft, circular piece of polyurethane foam. The sponge has spermicide in it. It is inserted into the vagina after wetting it and before sexual intercourse.  Spermicides. These are chemicals that kill or block sperm from entering the cervix and uterus. They come in the form of creams, jellies, suppositories, foam, or tablets. They do not require a prescription. They   are inserted into the vagina with an applicator before having sexual intercourse. The process must be repeated every time you have sexual intercourse. Intrauterine contraception  Intrauterine device (IUD). This is a T-shaped device that is put in a woman's uterus during  a menstrual period to prevent pregnancy. There are 2 types: ? Copper IUD. This type of IUD is wrapped in copper wire and is placed inside the uterus. Copper makes the uterus and fallopian tubes produce a fluid that kills sperm. It can stay in place for 10 years. ? Hormone IUD. This type of IUD contains the hormone progestin (synthetic progesterone). The hormone thickens the cervical mucus and prevents sperm from entering the uterus, and it also thins the uterine lining to prevent implantation of a fertilized egg. The hormone can weaken or kill the sperm that get into the uterus. It can stay in place for 3-5 years, depending on which type of IUD is used. Permanent methods of contraception  Female tubal ligation. This is when the woman's fallopian tubes are surgically sealed, tied, or blocked to prevent the egg from traveling to the uterus.  Hysteroscopic sterilization. This involves placing a small coil or insert into each fallopian tube. Your doctor uses a technique called hysteroscopy to do the procedure. The device causes scar tissue to form. This results in permanent blockage of the fallopian tubes, so the sperm cannot fertilize the egg. It takes about 3 months after the procedure for the tubes to become blocked. You must use another form of birth control for these 3 months.  Female sterilization. This is when the female has the tubes that carry sperm tied off (vasectomy).This blocks sperm from entering the vagina during sexual intercourse. After the procedure, the man can still ejaculate fluid (semen). Natural planning methods  Natural family planning. This is not having sexual intercourse or using a barrier method (condom, diaphragm, cervical cap) on days the woman could become pregnant.  Calendar method. This is keeping track of the length of each menstrual cycle and identifying when you are fertile.  Ovulation method. This is avoiding sexual intercourse during ovulation.  Symptothermal method.  This is avoiding sexual intercourse during ovulation, using a thermometer and ovulation symptoms.  Post-ovulation method. This is timing sexual intercourse after you have ovulated. Regardless of which type or method of contraception you choose, it is important that you use condoms to protect against the transmission of sexually transmitted infections (STIs). Talk with your health care provider about which form of contraception is most appropriate for you. This information is not intended to replace advice given to you by your health care provider. Make sure you discuss any questions you have with your health care provider. Document Released: 03/14/2005 Document Revised: 08/20/2015 Document Reviewed: 09/06/2012 Elsevier Interactive Patient Education  2017 [employer]  

## 2016-09-08 NOTE — Progress Notes (Signed)
Pt states pain under right breast has increased. Pt states some occ mild cramping.

## 2016-09-26 ENCOUNTER — Ambulatory Visit (INDEPENDENT_AMBULATORY_CARE_PROVIDER_SITE_OTHER): Payer: Medicaid Other | Admitting: Obstetrics and Gynecology

## 2016-09-26 VITALS — BP 100/66 | HR 79 | Wt 147.0 lb

## 2016-09-26 DIAGNOSIS — Z3480 Encounter for supervision of other normal pregnancy, unspecified trimester: Secondary | ICD-10-CM

## 2016-09-26 DIAGNOSIS — Z3483 Encounter for supervision of other normal pregnancy, third trimester: Secondary | ICD-10-CM

## 2016-09-26 NOTE — Progress Notes (Signed)
   PRENATAL VISIT NOTE  Subjective:  Coralee Hrivnak is a 21 y.o. G2P0010 at 7739w0d being seen today for ongoing prenatal care.  She is currently monitored for the following issues for this low-risk pregnancy and has Supervision of normal pregnancy, antepartum on her problem list.  Patient reports no complaints.  Contractions: Not present. Vag. Bleeding: None.  Movement: Present. Denies leaking of fluid.   The following portions of the patient's history were reviewed and updated as appropriate: allergies, current medications, past family history, past medical history, past social history, past surgical history and problem list. Problem list updated.  Objective:   Vitals:   09/26/16 0834  BP: 100/66  Pulse: 79  Weight: 147 lb (66.7 kg)    Fetal Status: Fetal Heart Rate (bpm): 145 Fundal Height: 32 cm Movement: Present     General:  Alert, oriented and cooperative. Patient is in no acute distress.  Skin: Skin is warm and dry. No rash noted.   Cardiovascular: Normal heart rate noted  Respiratory: Normal respiratory effort, no problems with respiration noted  Abdomen: Soft, gravid, appropriate for gestational age. Pain/Pressure: Present     Pelvic:  Cervical exam deferred        Extremities: Normal range of motion.  Edema: None  Mental Status: Normal mood and affect. Normal behavior. Normal judgment and thought content.   Assessment and Plan:  Pregnancy: G2P0010 at 9939w0d  1. Supervision of other normal pregnancy, antepartum Patient is doing well without complaints She is requesting a primary c-section due to fear of pain in labor. Discussed the different pain management options during labor. Also explained to the patient that primary cesarean section are not performed without a medical maternal/fetal indication.  Patient and her mother verbalized understanding and all questions were answered  Preterm labor symptoms and general obstetric precautions including but not limited to  vaginal bleeding, contractions, leaking of fluid and fetal movement were reviewed in detail with the patient. Please refer to After Visit Summary for other counseling recommendations.  Return in about 2 weeks (around 10/10/2016).   Catalina AntiguaPeggy Darling Cieslewicz, MD

## 2016-10-11 ENCOUNTER — Ambulatory Visit (INDEPENDENT_AMBULATORY_CARE_PROVIDER_SITE_OTHER): Payer: Medicaid Other | Admitting: Obstetrics and Gynecology

## 2016-10-11 VITALS — BP 108/72 | HR 89 | Wt 150.0 lb

## 2016-10-11 DIAGNOSIS — Z3483 Encounter for supervision of other normal pregnancy, third trimester: Secondary | ICD-10-CM

## 2016-10-11 DIAGNOSIS — Z3480 Encounter for supervision of other normal pregnancy, unspecified trimester: Secondary | ICD-10-CM

## 2016-10-11 NOTE — Progress Notes (Signed)
Subjective:  Shirley Hall is a 21 y.o. G2P0010 at 8549w1d being seen today for ongoing prenatal care.  She is currently monitored for the following issues for this low-risk pregnancy and has Supervision of normal pregnancy, antepartum on her problem list.  Patient reports no complaints.  Contractions: Not present. Vag. Bleeding: None.  Movement: Present. Denies leaking of fluid.   The following portions of the patient's history were reviewed and updated as appropriate: allergies, current medications, past family history, past medical history, past social history, past surgical history and problem list. Problem list updated.  Objective:   Vitals:   10/11/16 0932  BP: 108/72  Pulse: 89  Weight: 150 lb (68 kg)    Fetal Status: Fetal Heart Rate (bpm): 138   Movement: Present     General:  Alert, oriented and cooperative. Patient is in no acute distress.  Skin: Skin is warm and dry. No rash noted.   Cardiovascular: Normal heart rate noted  Respiratory: Normal respiratory effort, no problems with respiration noted  Abdomen: Soft, gravid, appropriate for gestational age. Pain/Pressure: Present     Pelvic:  Cervical exam deferred        Extremities: Normal range of motion.  Edema: None  Mental Status: Normal mood and affect. Normal behavior. Normal judgment and thought content.   Urinalysis:      Assessment and Plan:  Pregnancy: G2P0010 at 5949w1d  1. Supervision of other normal pregnancy, antepartum Stable  Preterm labor symptoms and general obstetric precautions including but not limited to vaginal bleeding, contractions, leaking of fluid and fetal movement were reviewed in detail with the patient. Please refer to After Visit Summary for other counseling recommendations.  Return in about 2 weeks (around 10/25/2016) for OB visit.   Hermina StaggersErvin, Kameshia Madruga L, MD

## 2016-10-25 ENCOUNTER — Ambulatory Visit (INDEPENDENT_AMBULATORY_CARE_PROVIDER_SITE_OTHER): Payer: Medicaid Other | Admitting: Obstetrics and Gynecology

## 2016-10-25 ENCOUNTER — Other Ambulatory Visit (HOSPITAL_COMMUNITY)
Admission: RE | Admit: 2016-10-25 | Discharge: 2016-10-25 | Disposition: A | Discharge status: Home / Self Care | Payer: Medicaid Other | Source: Ambulatory Visit | Attending: Obstetrics and Gynecology | Admitting: Obstetrics and Gynecology

## 2016-10-25 VITALS — BP 111/74 | HR 90 | Wt 154.0 lb

## 2016-10-25 DIAGNOSIS — Z23 Encounter for immunization: Secondary | ICD-10-CM | POA: Diagnosis not present

## 2016-10-25 DIAGNOSIS — Z113 Encounter for screening for infections with a predominantly sexual mode of transmission: Secondary | ICD-10-CM

## 2016-10-25 DIAGNOSIS — Z3480 Encounter for supervision of other normal pregnancy, unspecified trimester: Secondary | ICD-10-CM | POA: Diagnosis not present

## 2016-10-25 DIAGNOSIS — Z3483 Encounter for supervision of other normal pregnancy, third trimester: Secondary | ICD-10-CM

## 2016-10-25 LAB — OB RESULTS CONSOLE GBS: GBS: NEGATIVE

## 2016-10-25 NOTE — Patient Instructions (Signed)
Vaginal Delivery Vaginal delivery means that you will give birth by pushing your baby out of your birth canal (vagina). A team of health care providers will help you before, during, and after vaginal delivery. Birth experiences are unique for every woman and every pregnancy, and birth experiences vary depending on where you choose to give birth. What should I do to prepare for my baby's birth? Before your baby is born, it is important to talk with your health care provider about:  Your labor and delivery preferences. These may include: ? Medicines that you may be given. ? How you will manage your pain. This might include non-medical pain relief techniques or injectable pain relief such as epidural analgesia. ? How you and your baby will be monitored during labor and delivery. ? Who may be in the labor and delivery room with you. ? Your feelings about surgical delivery of your baby (cesarean delivery, or C-section) if this becomes necessary. ? Your feelings about receiving donated blood through an IV tube (blood transfusion) if this becomes necessary.  Whether you are able: ? To take pictures or videos of the birth. ? To eat during labor and delivery. ? To move around, walk, or change positions during labor and delivery.  What to expect after your baby is born, such as: ? Whether delayed umbilical cord clamping and cutting is offered. ? Who will care for your baby right after birth. ? Medicines or tests that may be recommended for your baby. ? Whether breastfeeding is supported in your hospital or birth center. ? How long you will be in the hospital or birth center.  How any medical conditions you have may affect your baby or your labor and delivery experience.  To prepare for your baby's birth, you should also:  Attend all of your health care visits before delivery (prenatal visits) as recommended by your health care provider. This is important.  Prepare your home for your baby's  arrival. Make sure that you have: ? Diapers. ? Baby clothing. ? Feeding equipment. ? Safe sleeping arrangements for you and your baby.  Install a car seat in your vehicle. Have your car seat checked by a certified car seat installer to make sure that it is installed safely.  Think about who will help you with your new baby at home for at least the first several weeks after delivery.  What can I expect when I arrive at the birth center or hospital? Once you are in labor and have been admitted into the hospital or birth center, your health care provider may:  Review your pregnancy history and any concerns you have.  Insert an IV tube into one of your veins. This is used to give you fluids and medicines.  Check your blood pressure, pulse, temperature, and heart rate (vital signs).  Check whether your bag of water (amniotic sac) has broken (ruptured).  Talk with you about your birth plan and discuss pain control options.  Monitoring Your health care provider may monitor your contractions (uterine monitoring) and your baby's heart rate (fetal monitoring). You may need to be monitored:  Often, but not continuously (intermittently).  All the time or for long periods at a time (continuously). Continuous monitoring may be needed if: ? You are taking certain medicines, such as medicine to relieve pain or make your contractions stronger. ? You have pregnancy or labor complications.  Monitoring may be done by:  Placing a special stethoscope or a handheld monitoring device on your abdomen to   check your baby's heartbeat, and feeling your abdomen for contractions. This method of monitoring does not continuously record your baby's heartbeat or your contractions.  Placing monitors on your abdomen (external monitors) to record your baby's heartbeat and the frequency and length of contractions. You may not have to wear external monitors all the time.  Placing monitors inside of your uterus  (internal monitors) to record your baby's heartbeat and the frequency, length, and strength of your contractions. ? Your health care provider may use internal monitors if he or she needs more information about the strength of your contractions or your baby's heart rate. ? Internal monitors are put in place by passing a thin, flexible wire through your vagina and into your uterus. Depending on the type of monitor, it may remain in your uterus or on your baby's head until birth. ? Your health care provider will discuss the benefits and risks of internal monitoring with you and will ask for your permission before inserting the monitors.  Telemetry. This is a type of continuous monitoring that can be done with external or internal monitors. Instead of having to stay in bed, you are able to move around during telemetry. Ask your health care provider if telemetry is an option for you.  Physical exam Your health care provider may perform a physical exam. This may include:  Checking whether your baby is positioned: ? With the head toward your vagina (head-down). This is most common. ? With the head toward the top of your uterus (head-up or breech). If your baby is in a breech position, your health care provider may try to turn your baby to a head-down position so you can deliver vaginally. If it does not seem that your baby can be born vaginally, your provider may recommend surgery to deliver your baby. In rare cases, you may be able to deliver vaginally if your baby is head-up (breech delivery). ? Lying sideways (transverse). Babies that are lying sideways cannot be delivered vaginally.  Checking your cervix to determine: ? Whether it is thinning out (effacing). ? Whether it is opening up (dilating). ? How low your baby has moved into your birth canal.  What are the three stages of labor and delivery?  Normal labor and delivery is divided into the following three stages: Stage 1  Stage 1 is the  longest stage of labor, and it can last for hours or days. Stage 1 includes: ? Early labor. This is when contractions may be irregular, or regular and mild. Generally, early labor contractions are more than 10 minutes apart. ? Active labor. This is when contractions get longer, more regular, more frequent, and more intense. ? The transition phase. This is when contractions happen very close together, are very intense, and may last longer than during any other part of labor.  Contractions generally feel mild, infrequent, and irregular at first. They get stronger, more frequent (about every 2-3 minutes), and more regular as you progress from early labor through active labor and transition.  Many women progress through stage 1 naturally, but you may need help to continue making progress. If this happens, your health care provider may talk with you about: ? Rupturing your amniotic sac if it has not ruptured yet. ? Giving you medicine to help make your contractions stronger and more frequent.  Stage 1 ends when your cervix is completely dilated to 4 inches (10 cm) and completely effaced. This happens at the end of the transition phase. Stage 2  Once   your cervix is completely effaced and dilated to 4 inches (10 cm), you may start to feel an urge to push. It is common for the body to naturally take a rest before feeling the urge to push, especially if you received an epidural or certain other pain medicines. This rest period may last for up to 1-2 hours, depending on your unique labor experience.  During stage 2, contractions are generally less painful, because pushing helps relieve contraction pain. Instead of contraction pain, you may feel stretching and burning pain, especially when the widest part of your baby's head passes through the vaginal opening (crowning).  Your health care provider will closely monitor your pushing progress and your baby's progress through the vagina during stage 2.  Your  health care provider may massage the area of skin between your vaginal opening and anus (perineum) or apply warm compresses to your perineum. This helps it stretch as the baby's head starts to crown, which can help prevent perineal tearing. ? In some cases, an incision may be made in your perineum (episiotomy) to allow the baby to pass through the vaginal opening. An episiotomy helps to make the opening of the vagina larger to allow more room for the baby to fit through.  It is very important to breathe and focus so your health care provider can control the delivery of your baby's head. Your health care provider may have you decrease the intensity of your pushing, to help prevent perineal tearing.  After delivery of your baby's head, the shoulders and the rest of the body generally deliver very quickly and without difficulty.  Once your baby is delivered, the umbilical cord may be cut right away, or this may be delayed for 1-2 minutes, depending on your baby's health. This may vary among health care providers, hospitals, and birth centers.  If you and your baby are healthy enough, your baby may be placed on your chest or abdomen to help maintain the baby's temperature and to help you bond with each other. Some mothers and babies start breastfeeding at this time. Your health care team will dry your baby and help keep your baby warm during this time.  Your baby may need immediate care if he or she: ? Showed signs of distress during labor. ? Has a medical condition. ? Was born too early (prematurely). ? Had a bowel movement before birth (meconium). ? Shows signs of difficulty transitioning from being inside the uterus to being outside of the uterus. If you are planning to breastfeed, your health care team will help you begin a feeding. Stage 3  The third stage of labor starts immediately after the birth of your baby and ends after you deliver the placenta. The placenta is an organ that develops  during pregnancy to provide oxygen and nutrients to your baby in the womb.  Delivering the placenta may require some pushing, and you may have mild contractions. Breastfeeding can stimulate contractions to help you deliver the placenta.  After the placenta is delivered, your uterus should tighten (contract) and become firm. This helps to stop bleeding in your uterus. To help your uterus contract and to control bleeding, your health care provider may: ? Give you medicine by injection, through an IV tube, by mouth, or through your rectum (rectally). ? Massage your abdomen or perform a vaginal exam to remove any blood clots that are left in your uterus. ? Empty your bladder by placing a thin, flexible tube (catheter) into your bladder. ? Encourage   you to breastfeed your baby. After labor is over, you and your baby will be monitored closely to ensure that you are both healthy until you are ready to go home. Your health care team will teach you how to care for yourself and your baby. This information is not intended to replace advice given to you by your health care provider. Make sure you discuss any questions you have with your health care provider. Document Released: 12/22/2007 Document Revised: 10/02/2015 Document Reviewed: 03/29/2015 Elsevier Interactive Patient Education  2018 [employer]  

## 2016-10-25 NOTE — Progress Notes (Signed)
Subjective:  Shirley Hall is a 21 y.o. G2P0010 at 5468w1d being seen today for ongoing prenatal care.  She is currently monitored for the following issues for this low-risk pregnancy and has Supervision of normal pregnancy, antepartum on her problem list.  Patient reports no complaints.  Contractions: Not present. Vag. Bleeding: None.  Movement: Present. Denies leaking of fluid.   The following portions of the patient's history were reviewed and updated as appropriate: allergies, current medications, past family history, past medical history, past social history, past surgical history and problem list. Problem list updated.  Objective:   Vitals:   10/25/16 0812  BP: 111/74  Pulse: 90  Weight: 154 lb (69.9 kg)    Fetal Status: Fetal Heart Rate (bpm): 130   Movement: Present     General:  Alert, oriented and cooperative. Patient is in no acute distress.  Skin: Skin is warm and dry. No rash noted.   Cardiovascular: Normal heart rate noted  Respiratory: Normal respiratory effort, no problems with respiration noted  Abdomen: Soft, gravid, appropriate for gestational age. Pain/Pressure: Present     Pelvic:  Cervical exam performed        Extremities: Normal range of motion.  Edema: Trace  Mental Status: Normal mood and affect. Normal behavior. Normal judgment and thought content.   Urinalysis:      Assessment and Plan:  Pregnancy: G2P0010 at 3368w1d  1. Supervision of other normal pregnancy, antepartum Labor precautions - Strep Gp B NAA - Urine cytology ancillary only  Term labor symptoms and general obstetric precautions including but not limited to vaginal bleeding, contractions, leaking of fluid and fetal movement were reviewed in detail with the patient. Please refer to After Visit Summary for other counseling recommendations.  Return in about 1 week (around 11/01/2016) for OB visit.   Hermina StaggersErvin, Julez Huseby L, MD

## 2016-10-27 LAB — STREP GP B NAA: Strep Gp B NAA: NEGATIVE

## 2016-10-28 LAB — URINE CYTOLOGY ANCILLARY ONLY
CHLAMYDIA, DNA PROBE: NEGATIVE
NEISSERIA GONORRHEA: NEGATIVE

## 2016-11-01 ENCOUNTER — Ambulatory Visit (INDEPENDENT_AMBULATORY_CARE_PROVIDER_SITE_OTHER): Payer: Medicaid Other | Admitting: Obstetrics and Gynecology

## 2016-11-01 DIAGNOSIS — Z3483 Encounter for supervision of other normal pregnancy, third trimester: Secondary | ICD-10-CM

## 2016-11-01 DIAGNOSIS — Z3480 Encounter for supervision of other normal pregnancy, unspecified trimester: Secondary | ICD-10-CM

## 2016-11-01 NOTE — Progress Notes (Signed)
   PRENATAL VISIT NOTE  Subjective:  Shirley Hall is a 21 y.o. G2P0010 at 6453w1d being seen today for ongoing prenatal care.  She is currently monitored for the following issues for this low-risk pregnancy and has Supervision of normal pregnancy, antepartum on her problem list.  Patient reports no complaints.  Contractions: Not present. Vag. Bleeding: None.  Movement: Present. Denies leaking of fluid.   The following portions of the patient's history were reviewed and updated as appropriate: allergies, current medications, past family history, past medical history, past social history, past surgical history and problem list. Problem list updated.  Objective:   Vitals:   11/01/16 0838  BP: 109/73  Pulse: 93  Weight: 159 lb (72.1 kg)    Fetal Status: Fetal Heart Rate (bpm): 130 Fundal Height: 37 cm Movement: Present     General:  Alert, oriented and cooperative. Patient is in no acute distress.  Skin: Skin is warm and dry. No rash noted.   Cardiovascular: Normal heart rate noted  Respiratory: Normal respiratory effort, no problems with respiration noted  Abdomen: Soft, gravid, appropriate for gestational age.  Pain/Pressure: Present     Pelvic: Cervical exam deferred        Extremities: Normal range of motion.  Edema: Trace  Mental Status:  Normal mood and affect. Normal behavior. Normal judgment and thought content.   Assessment and Plan:  Pregnancy: G2P0010 at 7253w1d  1. Supervision of other normal pregnancy, antepartum Patient is doing well Labor precautions reviewed Culture results reviewed Patient plans nexplanon for contraception  Term labor symptoms and general obstetric precautions including but not limited to vaginal bleeding, contractions, leaking of fluid and fetal movement were reviewed in detail with the patient. Please refer to After Visit Summary for other counseling recommendations.  Return in about 1 week (around 11/08/2016) for ROB.   Catalina AntiguaPeggy Octa Uplinger,  MD

## 2016-11-04 ENCOUNTER — Inpatient Hospital Stay (HOSPITAL_COMMUNITY)
Admission: AD | Admit: 2016-11-04 | Discharge: 2016-11-05 | Disposition: A | Discharge status: Home / Self Care | Payer: Medicaid Other | Source: Ambulatory Visit | Attending: Obstetrics & Gynecology | Admitting: Obstetrics & Gynecology

## 2016-11-04 ENCOUNTER — Encounter (HOSPITAL_COMMUNITY): Payer: Self-pay | Admitting: *Deleted

## 2016-11-04 DIAGNOSIS — O471 False labor at or after 37 completed weeks of gestation: Secondary | ICD-10-CM | POA: Insufficient documentation

## 2016-11-04 DIAGNOSIS — Z3A38 38 weeks gestation of pregnancy: Secondary | ICD-10-CM | POA: Insufficient documentation

## 2016-11-04 NOTE — Progress Notes (Signed)
Dr Cliffton AstersWhite notified of pt's admission and status. Aware of ctx pattern, 10x10 accels, sve, Will watch an hour and reck cervix

## 2016-11-04 NOTE — Progress Notes (Signed)
Baby very active 

## 2016-11-04 NOTE — MAU Note (Signed)
Pt presents to MAU c/o ctxs that are irregular that began around 2119 tonight while she was at work. Pt states no bleeding or LOF. Pt reports good FM.

## 2016-11-05 DIAGNOSIS — Z3A38 38 weeks gestation of pregnancy: Secondary | ICD-10-CM | POA: Diagnosis not present

## 2016-11-05 DIAGNOSIS — O471 False labor at or after 37 completed weeks of gestation: Secondary | ICD-10-CM | POA: Diagnosis present

## 2016-11-05 NOTE — Progress Notes (Signed)
[facility] Will call back for update of pt when finished

## 2016-11-05 NOTE — Discharge Instructions (Signed)

## 2016-11-05 NOTE — Progress Notes (Signed)
Philipp DeputyKim Shaw CNM called MAU and aware of repeat sve, ctx pattern, FHR reactive. Pt not very uncomfortable with ctxs. Will dc home

## 2016-11-05 NOTE — Progress Notes (Signed)
Written and verbal d/c instructions given and understanding voiced. 

## 2016-11-08 ENCOUNTER — Other Ambulatory Visit (HOSPITAL_COMMUNITY)
Admission: RE | Admit: 2016-11-08 | Discharge: 2016-11-08 | Disposition: A | Discharge status: Home / Self Care | Payer: Medicaid Other | Source: Ambulatory Visit | Attending: Obstetrics & Gynecology | Admitting: Obstetrics & Gynecology

## 2016-11-08 ENCOUNTER — Ambulatory Visit (INDEPENDENT_AMBULATORY_CARE_PROVIDER_SITE_OTHER): Payer: Medicaid Other | Admitting: Obstetrics & Gynecology

## 2016-11-08 VITALS — BP 120/81 | HR 101 | Wt 161.0 lb

## 2016-11-08 DIAGNOSIS — N898 Other specified noninflammatory disorders of vagina: Secondary | ICD-10-CM

## 2016-11-08 DIAGNOSIS — Z3483 Encounter for supervision of other normal pregnancy, third trimester: Secondary | ICD-10-CM

## 2016-11-08 DIAGNOSIS — Z3480 Encounter for supervision of other normal pregnancy, unspecified trimester: Secondary | ICD-10-CM

## 2016-11-08 NOTE — Patient Instructions (Signed)
Vaginal Delivery Vaginal delivery means that you will give birth by pushing your baby out of your birth canal (vagina). A team of health care providers will help you before, during, and after vaginal delivery. Birth experiences are unique for every woman and every pregnancy, and birth experiences vary depending on where you choose to give birth. What should I do to prepare for my baby's birth? Before your baby is born, it is important to talk with your health care provider about:  Your labor and delivery preferences. These may include: ? Medicines that you may be given. ? How you will manage your pain. This might include non-medical pain relief techniques or injectable pain relief such as epidural analgesia. ? How you and your baby will be monitored during labor and delivery. ? Who may be in the labor and delivery room with you. ? Your feelings about surgical delivery of your baby (cesarean delivery, or C-section) if this becomes necessary. ? Your feelings about receiving donated blood through an IV tube (blood transfusion) if this becomes necessary.  Whether you are able: ? To take pictures or videos of the birth. ? To eat during labor and delivery. ? To move around, walk, or change positions during labor and delivery.  What to expect after your baby is born, such as: ? Whether delayed umbilical cord clamping and cutting is offered. ? Who will care for your baby right after birth. ? Medicines or tests that may be recommended for your baby. ? Whether breastfeeding is supported in your hospital or birth center. ? How long you will be in the hospital or birth center.  How any medical conditions you have may affect your baby or your labor and delivery experience.  To prepare for your baby's birth, you should also:  Attend all of your health care visits before delivery (prenatal visits) as recommended by your health care provider. This is important.  Prepare your home for your baby's  arrival. Make sure that you have: ? Diapers. ? Baby clothing. ? Feeding equipment. ? Safe sleeping arrangements for you and your baby.  Install a car seat in your vehicle. Have your car seat checked by a certified car seat installer to make sure that it is installed safely.  Think about who will help you with your new baby at home for at least the first several weeks after delivery.  What can I expect when I arrive at the birth center or hospital? Once you are in labor and have been admitted into the hospital or birth center, your health care provider may:  Review your pregnancy history and any concerns you have.  Insert an IV tube into one of your veins. This is used to give you fluids and medicines.  Check your blood pressure, pulse, temperature, and heart rate (vital signs).  Check whether your bag of water (amniotic sac) has broken (ruptured).  Talk with you about your birth plan and discuss pain control options.  Monitoring Your health care provider may monitor your contractions (uterine monitoring) and your baby's heart rate (fetal monitoring). You may need to be monitored:  Often, but not continuously (intermittently).  All the time or for long periods at a time (continuously). Continuous monitoring may be needed if: ? You are taking certain medicines, such as medicine to relieve pain or make your contractions stronger. ? You have pregnancy or labor complications.  Monitoring may be done by:  Placing a special stethoscope or a handheld monitoring device on your abdomen to   check your baby's heartbeat, and feeling your abdomen for contractions. This method of monitoring does not continuously record your baby's heartbeat or your contractions.  Placing monitors on your abdomen (external monitors) to record your baby's heartbeat and the frequency and length of contractions. You may not have to wear external monitors all the time.  Placing monitors inside of your uterus  (internal monitors) to record your baby's heartbeat and the frequency, length, and strength of your contractions. ? Your health care provider may use internal monitors if he or she needs more information about the strength of your contractions or your baby's heart rate. ? Internal monitors are put in place by passing a thin, flexible wire through your vagina and into your uterus. Depending on the type of monitor, it may remain in your uterus or on your baby's head until birth. ? Your health care provider will discuss the benefits and risks of internal monitoring with you and will ask for your permission before inserting the monitors.  Telemetry. This is a type of continuous monitoring that can be done with external or internal monitors. Instead of having to stay in bed, you are able to move around during telemetry. Ask your health care provider if telemetry is an option for you.  Physical exam Your health care provider may perform a physical exam. This may include:  Checking whether your baby is positioned: ? With the head toward your vagina (head-down). This is most common. ? With the head toward the top of your uterus (head-up or breech). If your baby is in a breech position, your health care provider may try to turn your baby to a head-down position so you can deliver vaginally. If it does not seem that your baby can be born vaginally, your provider may recommend surgery to deliver your baby. In rare cases, you may be able to deliver vaginally if your baby is head-up (breech delivery). ? Lying sideways (transverse). Babies that are lying sideways cannot be delivered vaginally.  Checking your cervix to determine: ? Whether it is thinning out (effacing). ? Whether it is opening up (dilating). ? How low your baby has moved into your birth canal.  What are the three stages of labor and delivery?  Normal labor and delivery is divided into the following three stages: Stage 1  Stage 1 is the  longest stage of labor, and it can last for hours or days. Stage 1 includes: ? Early labor. This is when contractions may be irregular, or regular and mild. Generally, early labor contractions are more than 10 minutes apart. ? Active labor. This is when contractions get longer, more regular, more frequent, and more intense. ? The transition phase. This is when contractions happen very close together, are very intense, and may last longer than during any other part of labor.  Contractions generally feel mild, infrequent, and irregular at first. They get stronger, more frequent (about every 2-3 minutes), and more regular as you progress from early labor through active labor and transition.  Many women progress through stage 1 naturally, but you may need help to continue making progress. If this happens, your health care provider may talk with you about: ? Rupturing your amniotic sac if it has not ruptured yet. ? Giving you medicine to help make your contractions stronger and more frequent.  Stage 1 ends when your cervix is completely dilated to 4 inches (10 cm) and completely effaced. This happens at the end of the transition phase. Stage 2  Once   your cervix is completely effaced and dilated to 4 inches (10 cm), you may start to feel an urge to push. It is common for the body to naturally take a rest before feeling the urge to push, especially if you received an epidural or certain other pain medicines. This rest period may last for up to 1-2 hours, depending on your unique labor experience.  During stage 2, contractions are generally less painful, because pushing helps relieve contraction pain. Instead of contraction pain, you may feel stretching and burning pain, especially when the widest part of your baby's head passes through the vaginal opening (crowning).  Your health care provider will closely monitor your pushing progress and your baby's progress through the vagina during stage 2.  Your  health care provider may massage the area of skin between your vaginal opening and anus (perineum) or apply warm compresses to your perineum. This helps it stretch as the baby's head starts to crown, which can help prevent perineal tearing. ? In some cases, an incision may be made in your perineum (episiotomy) to allow the baby to pass through the vaginal opening. An episiotomy helps to make the opening of the vagina larger to allow more room for the baby to fit through.  It is very important to breathe and focus so your health care provider can control the delivery of your baby's head. Your health care provider may have you decrease the intensity of your pushing, to help prevent perineal tearing.  After delivery of your baby's head, the shoulders and the rest of the body generally deliver very quickly and without difficulty.  Once your baby is delivered, the umbilical cord may be cut right away, or this may be delayed for 1-2 minutes, depending on your baby's health. This may vary among health care providers, hospitals, and birth centers.  If you and your baby are healthy enough, your baby may be placed on your chest or abdomen to help maintain the baby's temperature and to help you bond with each other. Some mothers and babies start breastfeeding at this time. Your health care team will dry your baby and help keep your baby warm during this time.  Your baby may need immediate care if he or she: ? Showed signs of distress during labor. ? Has a medical condition. ? Was born too early (prematurely). ? Had a bowel movement before birth (meconium). ? Shows signs of difficulty transitioning from being inside the uterus to being outside of the uterus. If you are planning to breastfeed, your health care team will help you begin a feeding. Stage 3  The third stage of labor starts immediately after the birth of your baby and ends after you deliver the placenta. The placenta is an organ that develops  during pregnancy to provide oxygen and nutrients to your baby in the womb.  Delivering the placenta may require some pushing, and you may have mild contractions. Breastfeeding can stimulate contractions to help you deliver the placenta.  After the placenta is delivered, your uterus should tighten (contract) and become firm. This helps to stop bleeding in your uterus. To help your uterus contract and to control bleeding, your health care provider may: ? Give you medicine by injection, through an IV tube, by mouth, or through your rectum (rectally). ? Massage your abdomen or perform a vaginal exam to remove any blood clots that are left in your uterus. ? Empty your bladder by placing a thin, flexible tube (catheter) into your bladder. ? Encourage   you to breastfeed your baby. After labor is over, you and your baby will be monitored closely to ensure that you are both healthy until you are ready to go home. Your health care team will teach you how to care for yourself and your baby. This information is not intended to replace advice given to you by your health care provider. Make sure you discuss any questions you have with your health care provider. Document Released: 12/22/2007 Document Revised: 10/02/2015 Document Reviewed: 03/29/2015 Elsevier Interactive Patient Education  2018 [employer]  

## 2016-11-08 NOTE — Progress Notes (Signed)
   PRENATAL VISIT NOTE  Subjective:  Shirley Hall is a 21 y.o. G2P0010 at 8416w1d being seen today for ongoing prenatal care.  She is currently monitored for the following issues for this low-risk pregnancy and has Supervision of normal pregnancy, antepartum on her problem list.  Patient reports vaginal irritation and at urethra.  Contractions: Irregular. Vag. Bleeding: None.  Movement: Present. Denies leaking of fluid.   The following portions of the patient's history were reviewed and updated as appropriate: allergies, current medications, past family history, past medical history, past social history, past surgical history and problem list. Problem list updated.  Objective:   Vitals:   11/08/16 0825  BP: 120/81  Pulse: (!) 101  Weight: 73 kg (161 lb)    Fetal Status: Fetal Heart Rate (bpm): 150   Movement: Present     General:  Alert, oriented and cooperative. Patient is in no acute distress.  Skin: Skin is warm and dry. No rash noted.   Cardiovascular: Normal heart rate noted  Respiratory: Normal respiratory effort, no problems with respiration noted  Abdomen: Soft, gravid, appropriate for gestational age.  Pain/Pressure: Present     Pelvic: Cervical exam deferred        Extremities: Normal range of motion.  Edema: Trace  Mental Status:  Normal mood and affect. Normal behavior. Normal judgment and thought content.   Assessment and Plan:  Pregnancy: G2P0010 at 8916w1d  1. Supervision of other normal pregnancy, antepartum Testing for vaginitis, no vulvar lesions seen  Term labor symptoms and general obstetric precautions including but not limited to vaginal bleeding, contractions, leaking of fluid and fetal movement were reviewed in detail with the patient. Please refer to After Visit Summary for other counseling recommendations.  Return in about 1 week (around 11/15/2016).   Scheryl DarterJames Shacara Cozine, MD

## 2016-11-09 LAB — CERVICOVAGINAL ANCILLARY ONLY
Bacterial vaginitis: NEGATIVE
Candida vaginitis: POSITIVE — AB
Chlamydia: NEGATIVE
Neisseria Gonorrhea: NEGATIVE
Trichomonas: NEGATIVE

## 2016-11-15 ENCOUNTER — Encounter (HOSPITAL_COMMUNITY): Payer: Self-pay | Admitting: *Deleted

## 2016-11-15 ENCOUNTER — Telehealth (HOSPITAL_COMMUNITY): Payer: Self-pay | Admitting: *Deleted

## 2016-11-15 ENCOUNTER — Ambulatory Visit (INDEPENDENT_AMBULATORY_CARE_PROVIDER_SITE_OTHER): Payer: Medicaid Other | Admitting: Obstetrics & Gynecology

## 2016-11-15 DIAGNOSIS — Z3480 Encounter for supervision of other normal pregnancy, unspecified trimester: Secondary | ICD-10-CM

## 2016-11-15 DIAGNOSIS — Z3483 Encounter for supervision of other normal pregnancy, third trimester: Secondary | ICD-10-CM

## 2016-11-15 NOTE — Telephone Encounter (Signed)
Preadmission screen  

## 2016-11-15 NOTE — Patient Instructions (Signed)
Vaginal Delivery Vaginal delivery means that you will give birth by pushing your baby out of your birth canal (vagina). A team of health care providers will help you before, during, and after vaginal delivery. Birth experiences are unique for every woman and every pregnancy, and birth experiences vary depending on where you choose to give birth. What should I do to prepare for my baby's birth? Before your baby is born, it is important to talk with your health care provider about:  Your labor and delivery preferences. These may include: ? Medicines that you may be given. ? How you will manage your pain. This might include non-medical pain relief techniques or injectable pain relief such as epidural analgesia. ? How you and your baby will be monitored during labor and delivery. ? Who may be in the labor and delivery room with you. ? Your feelings about surgical delivery of your baby (cesarean delivery, or C-section) if this becomes necessary. ? Your feelings about receiving donated blood through an IV tube (blood transfusion) if this becomes necessary.  Whether you are able: ? To take pictures or videos of the birth. ? To eat during labor and delivery. ? To move around, walk, or change positions during labor and delivery.  What to expect after your baby is born, such as: ? Whether delayed umbilical cord clamping and cutting is offered. ? Who will care for your baby right after birth. ? Medicines or tests that may be recommended for your baby. ? Whether breastfeeding is supported in your hospital or birth center. ? How long you will be in the hospital or birth center.  How any medical conditions you have may affect your baby or your labor and delivery experience.  To prepare for your baby's birth, you should also:  Attend all of your health care visits before delivery (prenatal visits) as recommended by your health care provider. This is important.  Prepare your home for your baby's  arrival. Make sure that you have: ? Diapers. ? Baby clothing. ? Feeding equipment. ? Safe sleeping arrangements for you and your baby.  Install a car seat in your vehicle. Have your car seat checked by a certified car seat installer to make sure that it is installed safely.  Think about who will help you with your new baby at home for at least the first several weeks after delivery.  What can I expect when I arrive at the birth center or hospital? Once you are in labor and have been admitted into the hospital or birth center, your health care provider may:  Review your pregnancy history and any concerns you have.  Insert an IV tube into one of your veins. This is used to give you fluids and medicines.  Check your blood pressure, pulse, temperature, and heart rate (vital signs).  Check whether your bag of water (amniotic sac) has broken (ruptured).  Talk with you about your birth plan and discuss pain control options.  Monitoring Your health care provider may monitor your contractions (uterine monitoring) and your baby's heart rate (fetal monitoring). You may need to be monitored:  Often, but not continuously (intermittently).  All the time or for long periods at a time (continuously). Continuous monitoring may be needed if: ? You are taking certain medicines, such as medicine to relieve pain or make your contractions stronger. ? You have pregnancy or labor complications.  Monitoring may be done by:  Placing a special stethoscope or a handheld monitoring device on your abdomen to   check your baby's heartbeat, and feeling your abdomen for contractions. This method of monitoring does not continuously record your baby's heartbeat or your contractions.  Placing monitors on your abdomen (external monitors) to record your baby's heartbeat and the frequency and length of contractions. You may not have to wear external monitors all the time.  Placing monitors inside of your uterus  (internal monitors) to record your baby's heartbeat and the frequency, length, and strength of your contractions. ? Your health care provider may use internal monitors if he or she needs more information about the strength of your contractions or your baby's heart rate. ? Internal monitors are put in place by passing a thin, flexible wire through your vagina and into your uterus. Depending on the type of monitor, it may remain in your uterus or on your baby's head until birth. ? Your health care provider will discuss the benefits and risks of internal monitoring with you and will ask for your permission before inserting the monitors.  Telemetry. This is a type of continuous monitoring that can be done with external or internal monitors. Instead of having to stay in bed, you are able to move around during telemetry. Ask your health care provider if telemetry is an option for you.  Physical exam Your health care provider may perform a physical exam. This may include:  Checking whether your baby is positioned: ? With the head toward your vagina (head-down). This is most common. ? With the head toward the top of your uterus (head-up or breech). If your baby is in a breech position, your health care provider may try to turn your baby to a head-down position so you can deliver vaginally. If it does not seem that your baby can be born vaginally, your provider may recommend surgery to deliver your baby. In rare cases, you may be able to deliver vaginally if your baby is head-up (breech delivery). ? Lying sideways (transverse). Babies that are lying sideways cannot be delivered vaginally.  Checking your cervix to determine: ? Whether it is thinning out (effacing). ? Whether it is opening up (dilating). ? How low your baby has moved into your birth canal.  What are the three stages of labor and delivery?  Normal labor and delivery is divided into the following three stages: Stage 1  Stage 1 is the  longest stage of labor, and it can last for hours or days. Stage 1 includes: ? Early labor. This is when contractions may be irregular, or regular and mild. Generally, early labor contractions are more than 10 minutes apart. ? Active labor. This is when contractions get longer, more regular, more frequent, and more intense. ? The transition phase. This is when contractions happen very close together, are very intense, and may last longer than during any other part of labor.  Contractions generally feel mild, infrequent, and irregular at first. They get stronger, more frequent (about every 2-3 minutes), and more regular as you progress from early labor through active labor and transition.  Many women progress through stage 1 naturally, but you may need help to continue making progress. If this happens, your health care provider may talk with you about: ? Rupturing your amniotic sac if it has not ruptured yet. ? Giving you medicine to help make your contractions stronger and more frequent.  Stage 1 ends when your cervix is completely dilated to 4 inches (10 cm) and completely effaced. This happens at the end of the transition phase. Stage 2  Once   your cervix is completely effaced and dilated to 4 inches (10 cm), you may start to feel an urge to push. It is common for the body to naturally take a rest before feeling the urge to push, especially if you received an epidural or certain other pain medicines. This rest period may last for up to 1-2 hours, depending on your unique labor experience.  During stage 2, contractions are generally less painful, because pushing helps relieve contraction pain. Instead of contraction pain, you may feel stretching and burning pain, especially when the widest part of your baby's head passes through the vaginal opening (crowning).  Your health care provider will closely monitor your pushing progress and your baby's progress through the vagina during stage 2.  Your  health care provider may massage the area of skin between your vaginal opening and anus (perineum) or apply warm compresses to your perineum. This helps it stretch as the baby's head starts to crown, which can help prevent perineal tearing. ? In some cases, an incision may be made in your perineum (episiotomy) to allow the baby to pass through the vaginal opening. An episiotomy helps to make the opening of the vagina larger to allow more room for the baby to fit through.  It is very important to breathe and focus so your health care provider can control the delivery of your baby's head. Your health care provider may have you decrease the intensity of your pushing, to help prevent perineal tearing.  After delivery of your baby's head, the shoulders and the rest of the body generally deliver very quickly and without difficulty.  Once your baby is delivered, the umbilical cord may be cut right away, or this may be delayed for 1-2 minutes, depending on your baby's health. This may vary among health care providers, hospitals, and birth centers.  If you and your baby are healthy enough, your baby may be placed on your chest or abdomen to help maintain the baby's temperature and to help you bond with each other. Some mothers and babies start breastfeeding at this time. Your health care team will dry your baby and help keep your baby warm during this time.  Your baby may need immediate care if he or she: ? Showed signs of distress during labor. ? Has a medical condition. ? Was born too early (prematurely). ? Had a bowel movement before birth (meconium). ? Shows signs of difficulty transitioning from being inside the uterus to being outside of the uterus. If you are planning to breastfeed, your health care team will help you begin a feeding. Stage 3  The third stage of labor starts immediately after the birth of your baby and ends after you deliver the placenta. The placenta is an organ that develops  during pregnancy to provide oxygen and nutrients to your baby in the womb.  Delivering the placenta may require some pushing, and you may have mild contractions. Breastfeeding can stimulate contractions to help you deliver the placenta.  After the placenta is delivered, your uterus should tighten (contract) and become firm. This helps to stop bleeding in your uterus. To help your uterus contract and to control bleeding, your health care provider may: ? Give you medicine by injection, through an IV tube, by mouth, or through your rectum (rectally). ? Massage your abdomen or perform a vaginal exam to remove any blood clots that are left in your uterus. ? Empty your bladder by placing a thin, flexible tube (catheter) into your bladder. ? Encourage   you to breastfeed your baby. After labor is over, you and your baby will be monitored closely to ensure that you are both healthy until you are ready to go home. Your health care team will teach you how to care for yourself and your baby. This information is not intended to replace advice given to you by your health care provider. Make sure you discuss any questions you have with your health care provider. Document Released: 12/22/2007 Document Revised: 10/02/2015 Document Reviewed: 03/29/2015 Elsevier Interactive Patient Education  2018 [employer]  

## 2016-11-15 NOTE — Progress Notes (Signed)
   PRENATAL VISIT NOTE  Subjective:  Shirley Hall is a 21 y.o. G2P0010 at [redacted]w[redacted]d being seen today for ongoing prenatal care.  She is currently monitored for the following issues for this low-risk pregnancy and has Supervision of normal pregnancy, antepartum on her problem list.  Patient reports no complaints.  Contractions: Irregular. Vag. Bleeding: None.  Movement: Present. Denies leaking of fluid.   The following portions of the patient's history were reviewed and updated as appropriate: allergies, current medications, past family history, past medical history, past social history, past surgical history and problem list. Problem list updated.  Objective:   Vitals:   11/15/16 0847  BP: 124/79  Pulse: 90  Weight: 72.6 kg (160 lb)    Fetal Status: Fetal Heart Rate (bpm): 130   Movement: Present     General:  Alert, oriented and cooperative. Patient is in no acute distress.  Skin: Skin is warm and dry. No rash noted.   Cardiovascular: Normal heart rate noted  Respiratory: Normal respiratory effort, no problems with respiration noted  Abdomen: Soft, gravid, appropriate for gestational age.  Pain/Pressure: Present     Pelvic: Cervical exam performed        Extremities: Normal range of motion.  Edema: Trace  Mental Status:  Normal mood and affect. Normal behavior. Normal judgment and thought content.   Assessment and Plan:  Pregnancy: G2P0010 at [redacted]w[redacted]d  1. Supervision of other normal pregnancy, antepartum Treated for yeast vaginitis  Term labor symptoms and general obstetric precautions including but not limited to vaginal bleeding, contractions, leaking of fluid and fetal movement were reviewed in detail with the patient. Please refer to After Visit Summary for other counseling recommendations.  Return in about 1 week (around 11/22/2016). IOL 41 weeks  Scheryl Darter, MD

## 2016-11-17 ENCOUNTER — Inpatient Hospital Stay (HOSPITAL_COMMUNITY)
Admission: AD | Admit: 2016-11-17 | Discharge: 2016-11-19 | DRG: 775 | Disposition: A | Discharge status: Home / Self Care | Payer: Medicaid Other | Source: Ambulatory Visit | Attending: Family Medicine | Admitting: Family Medicine

## 2016-11-17 ENCOUNTER — Encounter (HOSPITAL_COMMUNITY): Payer: Self-pay | Admitting: *Deleted

## 2016-11-17 ENCOUNTER — Inpatient Hospital Stay (HOSPITAL_COMMUNITY): Payer: Medicaid Other | Admitting: Anesthesiology

## 2016-11-17 DIAGNOSIS — D649 Anemia, unspecified: Secondary | ICD-10-CM | POA: Diagnosis present

## 2016-11-17 DIAGNOSIS — O4290 Premature rupture of membranes, unspecified as to length of time between rupture and onset of labor, unspecified weeks of gestation: Secondary | ICD-10-CM | POA: Diagnosis present

## 2016-11-17 DIAGNOSIS — O9902 Anemia complicating childbirth: Secondary | ICD-10-CM | POA: Diagnosis present

## 2016-11-17 DIAGNOSIS — O4292 Full-term premature rupture of membranes, unspecified as to length of time between rupture and onset of labor: Secondary | ICD-10-CM | POA: Diagnosis present

## 2016-11-17 DIAGNOSIS — Z3A39 39 weeks gestation of pregnancy: Secondary | ICD-10-CM | POA: Diagnosis not present

## 2016-11-17 DIAGNOSIS — O4202 Full-term premature rupture of membranes, onset of labor within 24 hours of rupture: Secondary | ICD-10-CM | POA: Diagnosis not present

## 2016-11-17 LAB — CBC
HEMATOCRIT: 27.5 % — AB (ref 36.0–46.0)
HEMOGLOBIN: 8.5 g/dL — AB (ref 12.0–15.0)
MCH: 25.1 pg — ABNORMAL LOW (ref 26.0–34.0)
MCHC: 30.9 g/dL (ref 30.0–36.0)
MCV: 81.1 fL (ref 78.0–100.0)
Platelets: 272 10*3/uL (ref 150–400)
RBC: 3.39 MIL/uL — ABNORMAL LOW (ref 3.87–5.11)
RDW: 15.6 % — AB (ref 11.5–15.5)
WBC: 10.2 10*3/uL (ref 4.0–10.5)

## 2016-11-17 LAB — PREPARE RBC (CROSSMATCH)

## 2016-11-17 LAB — POCT FERN TEST: POCT Fern Test: POSITIVE

## 2016-11-17 MED ORDER — MISOPROSTOL 50MCG HALF TABLET
50.0000 ug | ORAL_TABLET | Freq: Once | ORAL | Status: AC
  Administered 2016-11-17: 50 ug via ORAL
  Filled 2016-11-17: qty 1

## 2016-11-17 MED ORDER — SODIUM CHLORIDE 0.9 % IV SOLN: Freq: Once | INTRAVENOUS | Status: DC

## 2016-11-17 MED ORDER — PHENYLEPHRINE 40 MCG/ML (10ML) SYRINGE FOR IV PUSH (FOR BLOOD PRESSURE SUPPORT)
80.0000 ug | PREFILLED_SYRINGE | INTRAVENOUS | Status: DC | PRN
  Filled 2016-11-17: qty 5

## 2016-11-17 MED ORDER — LACTATED RINGERS IV SOLN
INTRAVENOUS | Status: DC
  Administered 2016-11-17 – 2016-11-18 (×3): via INTRAVENOUS

## 2016-11-17 MED ORDER — FENTANYL CITRATE (PF) 100 MCG/2ML IJ SOLN: 50.0000 ug | INTRAMUSCULAR | Status: DC | PRN

## 2016-11-17 MED ORDER — OXYTOCIN 40 UNITS IN LACTATED RINGERS INFUSION - SIMPLE MED: 2.5000 [IU]/h | INTRAVENOUS | Status: DC

## 2016-11-17 MED ORDER — OXYTOCIN 40 UNITS IN LACTATED RINGERS INFUSION - SIMPLE MED
1.0000 m[IU]/min | INTRAVENOUS | Status: DC
  Administered 2016-11-18: 2 m[IU]/min via INTRAVENOUS
  Filled 2016-11-17: qty 1000

## 2016-11-17 MED ORDER — FLEET ENEMA 7-19 GM/118ML RE ENEM: 1.0000 | ENEMA | RECTAL | Status: DC | PRN

## 2016-11-17 MED ORDER — OXYTOCIN BOLUS FROM INFUSION
500.0000 mL | Freq: Once | INTRAVENOUS | Status: AC
  Administered 2016-11-18: 500 mL via INTRAVENOUS

## 2016-11-17 MED ORDER — OXYCODONE-ACETAMINOPHEN 5-325 MG PO TABS: 2.0000 | ORAL_TABLET | ORAL | Status: DC | PRN

## 2016-11-17 MED ORDER — EPHEDRINE 5 MG/ML INJ
10.0000 mg | INTRAVENOUS | Status: DC | PRN
  Filled 2016-11-17: qty 2

## 2016-11-17 MED ORDER — DIPHENHYDRAMINE HCL 50 MG/ML IJ SOLN: 12.5000 mg | INTRAMUSCULAR | Status: DC | PRN

## 2016-11-17 MED ORDER — ACETAMINOPHEN 325 MG PO TABS: 650.0000 mg | ORAL_TABLET | ORAL | Status: DC | PRN

## 2016-11-17 MED ORDER — LACTATED RINGERS IV SOLN
500.0000 mL | INTRAVENOUS | Status: DC | PRN
  Administered 2016-11-17: 1000 mL via INTRAVENOUS
  Administered 2016-11-17: 500 mL via INTRAVENOUS

## 2016-11-17 MED ORDER — LIDOCAINE HCL (PF) 1 % IJ SOLN
INTRAMUSCULAR | Status: DC | PRN
  Administered 2016-11-17 (×2): 5 mL via EPIDURAL

## 2016-11-17 MED ORDER — PHENYLEPHRINE 40 MCG/ML (10ML) SYRINGE FOR IV PUSH (FOR BLOOD PRESSURE SUPPORT)
80.0000 ug | PREFILLED_SYRINGE | INTRAVENOUS | Status: DC | PRN
  Filled 2016-11-17: qty 5
  Filled 2016-11-17: qty 10

## 2016-11-17 MED ORDER — PHENYLEPHRINE 40 MCG/ML (10ML) SYRINGE FOR IV PUSH (FOR BLOOD PRESSURE SUPPORT)
80.0000 ug | PREFILLED_SYRINGE | INTRAVENOUS | Status: DC | PRN
  Administered 2016-11-17: 80 ug via INTRAVENOUS
  Filled 2016-11-17: qty 5

## 2016-11-17 MED ORDER — SOD CITRATE-CITRIC ACID 500-334 MG/5ML PO SOLN: 30.0000 mL | ORAL | Status: DC | PRN

## 2016-11-17 MED ORDER — LIDOCAINE HCL (PF) 1 % IJ SOLN
30.0000 mL | INTRAMUSCULAR | Status: DC | PRN
  Filled 2016-11-17: qty 30

## 2016-11-17 MED ORDER — FENTANYL 2.5 MCG/ML BUPIVACAINE 1/10 % EPIDURAL INFUSION (WH - ANES)
14.0000 mL/h | INTRAMUSCULAR | Status: DC | PRN
  Administered 2016-11-17 – 2016-11-18 (×2): 14 mL/h via EPIDURAL
  Filled 2016-11-17 (×2): qty 100

## 2016-11-17 MED ORDER — LACTATED RINGERS IV SOLN: 500.0000 mL | Freq: Once | INTRAVENOUS | Status: DC

## 2016-11-17 MED ORDER — OXYCODONE-ACETAMINOPHEN 5-325 MG PO TABS: 1.0000 | ORAL_TABLET | ORAL | Status: DC | PRN

## 2016-11-17 MED ORDER — ONDANSETRON HCL 4 MG/2ML IJ SOLN: 4.0000 mg | Freq: Four times a day (QID) | INTRAMUSCULAR | Status: DC | PRN

## 2016-11-17 MED ORDER — TERBUTALINE SULFATE 1 MG/ML IJ SOLN
0.2500 mg | Freq: Once | INTRAMUSCULAR | Status: DC | PRN
  Filled 2016-11-17: qty 1

## 2016-11-17 NOTE — MAU Note (Signed)
Patient states water broke around 1500.  Not contracting, per patient.  Reports +fetal movement.  Denies vaginal bleeding.

## 2016-11-17 NOTE — Anesthesia Procedure Notes (Signed)
Epidural Patient location during procedure: OB Start time: 11/17/2016 11:21 PM End time: 11/17/2016 11:42 PM  Staffing Anesthesiologist: Jairo Ben Performed: anesthesiologist   Preanesthetic Checklist Completed: patient identified, surgical consent, pre-op evaluation, timeout performed, IV checked, risks and benefits discussed and monitors and equipment checked  Epidural Patient position: sitting Prep: site prepped and draped and DuraPrep Patient monitoring: blood pressure, continuous pulse ox and heart rate Approach: midline Location: L3-L4 Injection technique: LOR air  Needle:  Needle type: Tuohy  Needle gauge: 17 G Needle length: 9 cm Needle insertion depth: 4 cm Catheter type: closed end flexible Catheter size: 19 Gauge Catheter at skin depth: 9 cm Test dose: negative (1% lidocaine)  Assessment Events: blood not aspirated, injection not painful, no injection resistance, negative IV test and no paresthesia  Additional Notes Pt identified in Labor room.  Monitors applied. Working IV access confirmed. Sterile prep, drape lumbar spine.  1% lido local L 3,4.  #17ga Touhy LOR air at 4 cm L 3,4, cath in easily to 9 cm skin. Test dose OK, cath dosed and infusion begun.  Patient asymptomatic, VSS, no heme aspirated, tolerated well.  Sandford Craze, MDReason for block:procedure for pain

## 2016-11-17 NOTE — Progress Notes (Addendum)
Vitals:   11/17/16 1934 11/17/16 2004  BP: 111/62 108/72  Pulse: 80 (!) 105  Resp: 16   Temp: 98.1 F (36.7 C) 98.1 F (36.7 C)  SpO2:     FHR Cat 1. Ctx mild and irregular.  Discussed options regarding IOL/expectant management.  Pt desires IOL.  Will administer oral cytotec X1 and then start pitocin in 4 hours if not in labor

## 2016-11-17 NOTE — Anesthesia Preprocedure Evaluation (Addendum)
Anesthesia Evaluation  Patient identified by MRN, date of birth, ID band Patient awake    Reviewed: Allergy & Precautions, NPO status , Patient's Chart, lab work & pertinent test results  History of Anesthesia Complications Negative for: history of anesthetic complications  Airway Mallampati: II  TM Distance: >3 FB Neck ROM: Full    Dental  (+) Dental Advisory Given   Pulmonary neg pulmonary ROS,    Pulmonary exam normal breath sounds clear to auscultation       Cardiovascular negative cardio ROS   Rhythm:Regular Rate:Normal     Neuro/Psych negative neurological ROS     GI/Hepatic negative GI ROS, Neg liver ROS,   Endo/Other  negative endocrine ROS  Renal/GU negative Renal ROS     Musculoskeletal   Abdominal   Peds  Hematology  (+) Blood dyscrasia (Hb 8.5, plt 272k), anemia ,   Anesthesia Other Findings   Reproductive/Obstetrics (+) Pregnancy                            Anesthesia Physical Anesthesia Plan  ASA: II  Anesthesia Plan: Epidural   Post-op Pain Management:    Induction:   PONV Risk Score and Plan: Treatment may vary due to age or medical condition  Airway Management Planned: Natural Airway  Additional Equipment:   Intra-op Plan:   Post-operative Plan:   Informed Consent: I have reviewed the patients History and Physical, chart, labs and discussed the procedure including the risks, benefits and alternatives for the proposed anesthesia with the patient or authorized representative who has indicated his/her understanding and acceptance.   Dental advisory given  Plan Discussed with:   Anesthesia Plan Comments: (Patient identified. Risks/Benefits/Options discussed with patient including but not limited to bleeding, infection, nerve damage, paralysis, failed block, incomplete pain control, headache, blood pressure changes, nausea, vomiting, reactions to medication  both or allergic, itching and postpartum back pain. Confirmed with bedside nurse the patient's most recent platelet count. Confirmed with patient that they are not currently taking any anticoagulation, have any bleeding history or any family history of bleeding disorders. Patient expressed understanding and wished to proceed. All questions were answered. )       Anesthesia Quick Evaluation

## 2016-11-17 NOTE — H&P (Signed)
LABOR ADMISSION HISTORY AND PHYSICAL  Shirley Hall is a 21 y.o. female G2P0010 with IUP at [redacted]w[redacted]d by LMP presenting for PROM. Patient admitted from MAU because water broke around 3pm. She reports +FMs, + LOF, no VB, no blurry vision, no headaches, no peripheral edema, and no RUQ pain.  She plans on breast feeding. She requests nexplanon for birth control.  Dating: By LMP --->  Estimated Date of Delivery: 11/21/16  Sono:   @[redacted]w[redacted]d , CWD, normal anatomy, breech presentation, 379g, 60% EFW  Prenatal History/Complications: Prenatal Care: [place]   Past Medical History: Past Medical History:  Diagnosis Date  . Anemia   . Pregnant     Past Surgical History: Past Surgical History:  Procedure Laterality Date  . NO PAST SURGERIES      Obstetrical History: OB History    Gravida Para Term Preterm AB Living   2       1     SAB TAB Ectopic Multiple Live Births   1              Social History: Social History   Social History  . Marital status: Single    Spouse name: N/A  . Number of children: N/A  . Years of education: N/A   Social History Main Topics  . Smoking status: Never Smoker  . Smokeless tobacco: Never Used  . Alcohol use No  . Drug use: No  . Sexual activity: Yes    Birth control/ protection: None   Other Topics Concern  . None   Social History Narrative  . None    Family History: History reviewed. No pertinent family history.  Allergies: Allergies  Allergen Reactions  . Amoxicillin Other (See Comments)    States father advised her family history of allergy. Pt has never had this med     Prescriptions Prior to Admission  Medication Sig Dispense Refill Last Dose  . Prenatal Vit-Fe Fumarate-FA (PRENATAL MULTIVITAMIN) TABS tablet Take 1 tablet by mouth daily at 12 noon.   Taking     Review of Systems   All systems reviewed and negative except as stated in HPI  Blood pressure 120/77, pulse (!) 103, temperature 98.2 F (36.8 C), temperature source  Oral, resp. rate 16, last menstrual period 02/15/2016, SpO2 97 %, unknown if currently breastfeeding. General appearance: alert, cooperative and no distress Lungs: normal work of breathing Extremities: no sign of DVT   Presentation: cephalic Fetal monitoringBaseline: 125 bpm, Variability: Good {> 6 bpm), Accelerations: Reactive and Decelerations: Absent Uterine activityFrequency: Every 2-5 minutes Dilation: 2.5 Effacement (%): 90 Station: -2 Exam by:: Shirley Hall, Medical student; Shirley Ladd, RN    Prenatal labs: ABO, Rh: --/--/B POS (08/23 1649) Antibody: PENDING (08/23 1649) Rubella: Immune RPR: Non Reactive (05/24 1011)  HBsAg: Negative (02/01 1519)  HIV:   Non Reactive GBS: Negative (07/31 0856)  GTT: Fasting- 73, 1 hr- 118, 2 hr- 99  Prenatal Transfer Tool  Maternal Diabetes: No Genetic Screening: Declined Maternal Ultrasounds/Referrals: Normal Fetal Ultrasounds or other Referrals:  None Maternal Substance Abuse:  No Significant Maternal Medications:  None Significant Maternal Lab Results: None  Results for orders placed or performed during the hospital encounter of 11/17/16 (from the past 24 hour(s))  POCT fern test   Collection Time: 11/17/16  4:18 PM  Result Value Ref Range   POCT Fern Test Positive = ruptured amniotic membanes   CBC   Collection Time: 11/17/16  4:49 PM  Result Value Ref Range   WBC 10.2  4.0 - 10.5 K/uL   RBC 3.39 (L) 3.87 - 5.11 MIL/uL   Hemoglobin 8.5 (L) 12.0 - 15.0 g/dL   HCT 16.1 (L) 09.6 - 04.5 %   MCV 81.1 78.0 - 100.0 fL   MCH 25.1 (L) 26.0 - 34.0 pg   MCHC 30.9 30.0 - 36.0 g/dL   RDW 40.9 (H) 81.1 - 91.4 %   Platelets 272 150 - 400 K/uL  Type and screen [facility]   Collection Time: 11/17/16  4:49 PM  Result Value Ref Range   ABO/RH(D) B POS    Antibody Screen PENDING    Sample Expiration 11/20/2016     Patient Active Problem List   Diagnosis Date Noted  . Leakage of amniotic fluid 11/17/2016  .  Supervision of normal pregnancy, antepartum 04/28/2016    Assessment: Shirley Hall is a 21 y.o. G2P0010 at [redacted]w[redacted]d here for PROM  #Labor: 2.5 cm dilated, 90% effaced, -2, ROM at 3pm #Pain: Epidural  #FWB: Cat I #ID: GBS neg #MOF: breast #MOC: nexplanon #Circ: declined  Expectant management  Shirley Hall 11/17/2016, 5:26 PM   I have seen and examined this patient and agree with the management plan.

## 2016-11-17 NOTE — Progress Notes (Signed)
Vitals:   11/17/16 2325 11/17/16 2332  BP: 124/85 117/74  Pulse: 80 77  Resp:    Temp:    SpO2: 100% 100%   Wants epidural, ctx have gotten much stronger and frequent in the past hour.  No change in cx. WIll get epidural

## 2016-11-17 NOTE — Anesthesia Pain Management Evaluation Note (Signed)
  CRNA Pain Management Visit Note  Patient: Shirley Hall, 21 y.o., female  "Hello I am a member of the anesthesia team at [facility]. We have an anesthesia team available at all times to provide care throughout the hospital, including epidural management and anesthesia for C-section. I don't know your plan for the delivery whether it a natural birth, water birth, IV sedation, nitrous supplementation, doula or epidural, but we want to meet your pain goals."   1.Was your pain managed to your expectations on prior hospitalizations?   No prior hospitalizations  2.What is your expectation for pain management during this hospitalization?     Epidural  3.How can we help you reach that goal? Epidural when desired.   Record the patient's initial score and the patient's pain goal.   Pain: 1  Pain Goal: 4 The [facility] wants you to be able to say your pain was always managed very well.  Frazier Balfour 11/17/2016

## 2016-11-18 ENCOUNTER — Encounter (HOSPITAL_COMMUNITY): Payer: Self-pay | Admitting: *Deleted

## 2016-11-18 DIAGNOSIS — Z3A39 39 weeks gestation of pregnancy: Secondary | ICD-10-CM

## 2016-11-18 DIAGNOSIS — O4202 Full-term premature rupture of membranes, onset of labor within 24 hours of rupture: Secondary | ICD-10-CM

## 2016-11-18 LAB — RPR: RPR Ser Ql: NONREACTIVE

## 2016-11-18 MED ORDER — SIMETHICONE 80 MG PO CHEW: 80.0000 mg | CHEWABLE_TABLET | ORAL | Status: DC | PRN

## 2016-11-18 MED ORDER — FERROUS SULFATE 325 (65 FE) MG PO TABS
325.0000 mg | ORAL_TABLET | Freq: Two times a day (BID) | ORAL | Status: DC
  Administered 2016-11-18 – 2016-11-19 (×2): 325 mg via ORAL
  Filled 2016-11-18 (×2): qty 1

## 2016-11-18 MED ORDER — ONDANSETRON HCL 4 MG PO TABS: 4.0000 mg | ORAL_TABLET | ORAL | Status: DC | PRN

## 2016-11-18 MED ORDER — DIPHENHYDRAMINE HCL 25 MG PO CAPS: 25.0000 mg | ORAL_CAPSULE | Freq: Four times a day (QID) | ORAL | Status: DC | PRN

## 2016-11-18 MED ORDER — WITCH HAZEL-GLYCERIN EX PADS: 1.0000 "application " | MEDICATED_PAD | CUTANEOUS | Status: DC | PRN

## 2016-11-18 MED ORDER — ZOLPIDEM TARTRATE 5 MG PO TABS: 5.0000 mg | ORAL_TABLET | Freq: Every evening | ORAL | Status: DC | PRN

## 2016-11-18 MED ORDER — METHYLERGONOVINE MALEATE 0.2 MG/ML IJ SOLN: 0.2000 mg | INTRAMUSCULAR | Status: DC | PRN

## 2016-11-18 MED ORDER — METHYLERGONOVINE MALEATE 0.2 MG PO TABS: 0.2000 mg | ORAL_TABLET | ORAL | Status: DC | PRN

## 2016-11-18 MED ORDER — DOCUSATE SODIUM 100 MG PO CAPS
100.0000 mg | ORAL_CAPSULE | Freq: Two times a day (BID) | ORAL | Status: DC
  Administered 2016-11-18 – 2016-11-19 (×2): 100 mg via ORAL
  Filled 2016-11-18 (×2): qty 1

## 2016-11-18 MED ORDER — MEASLES, MUMPS & RUBELLA VAC ~~LOC~~ INJ
0.5000 mL | INJECTION | Freq: Once | SUBCUTANEOUS | Status: DC
  Filled 2016-11-18: qty 0.5

## 2016-11-18 MED ORDER — FLEET ENEMA 7-19 GM/118ML RE ENEM: 1.0000 | ENEMA | Freq: Every day | RECTAL | Status: DC | PRN

## 2016-11-18 MED ORDER — BISACODYL 10 MG RE SUPP
10.0000 mg | Freq: Every day | RECTAL | Status: DC | PRN
Start: 2016-11-18 — End: 2016-11-19

## 2016-11-18 MED ORDER — COCONUT OIL OIL: 1.0000 "application " | TOPICAL_OIL | Status: DC | PRN

## 2016-11-18 MED ORDER — TETANUS-DIPHTH-ACELL PERTUSSIS 5-2.5-18.5 LF-MCG/0.5 IM SUSP: 0.5000 mL | Freq: Once | INTRAMUSCULAR | Status: DC

## 2016-11-18 MED ORDER — PRENATAL MULTIVITAMIN CH
1.0000 | ORAL_TABLET | Freq: Every day | ORAL | Status: DC
  Administered 2016-11-18 – 2016-11-19 (×2): 1 via ORAL
  Filled 2016-11-18 (×2): qty 1

## 2016-11-18 MED ORDER — OXYCODONE HCL 5 MG PO TABS
10.0000 mg | ORAL_TABLET | ORAL | Status: DC | PRN
Start: 2016-11-18 — End: 2016-11-19

## 2016-11-18 MED ORDER — BENZOCAINE-MENTHOL 20-0.5 % EX AERO
1.0000 "application " | INHALATION_SPRAY | CUTANEOUS | Status: DC | PRN
  Administered 2016-11-18: 1 via TOPICAL
  Filled 2016-11-18: qty 56

## 2016-11-18 MED ORDER — ACETAMINOPHEN 325 MG PO TABS: 650.0000 mg | ORAL_TABLET | ORAL | Status: DC | PRN

## 2016-11-18 MED ORDER — OXYCODONE HCL 5 MG PO TABS: 5.0000 mg | ORAL_TABLET | ORAL | Status: DC | PRN

## 2016-11-18 MED ORDER — IBUPROFEN 600 MG PO TABS
600.0000 mg | ORAL_TABLET | Freq: Four times a day (QID) | ORAL | Status: DC
  Administered 2016-11-18 – 2016-11-19 (×5): 600 mg via ORAL
  Filled 2016-11-18 (×5): qty 1

## 2016-11-18 MED ORDER — DIBUCAINE 1 % RE OINT: 1.0000 "application " | TOPICAL_OINTMENT | RECTAL | Status: DC | PRN

## 2016-11-18 MED ORDER — ONDANSETRON HCL 4 MG/2ML IJ SOLN: 4.0000 mg | INTRAMUSCULAR | Status: DC | PRN

## 2016-11-18 NOTE — Progress Notes (Signed)
C/C/+2 w/strong urge to push. FHR Cat 1. Ctx q 2 minutes  Pitocin at 6 mu/min.  Will start pushing

## 2016-11-18 NOTE — Anesthesia Postprocedure Evaluation (Signed)
Anesthesia Post Note  Patient: Shirley Hall  Procedure(s) Performed: * No procedures listed *     Patient location during evaluation: Mother Baby Anesthesia Type: Epidural Level of consciousness: awake, awake and alert, oriented and patient cooperative Pain management: pain level controlled Vital Signs Assessment: post-procedure vital signs reviewed and stable Respiratory status: spontaneous breathing, nonlabored ventilation and respiratory function stable Cardiovascular status: stable Postop Assessment: no headache, no backache, patient able to bend at knees and no signs of nausea or vomiting Anesthetic complications: no    Last Vitals:  Vitals:   11/18/16 0945 11/18/16 1100  BP: 122/71 116/68  Pulse: 90 87  Resp: 16 18  Temp: 37.2 C 36.8 C  SpO2:      Last Pain:  Vitals:   11/18/16 1257  TempSrc:   PainSc: 1    Pain Goal: Patients Stated Pain Goal: 2 (11/18/16 1257)               Emlyn Maves L

## 2016-11-18 NOTE — Plan of Care (Signed)
Problem: Education: Goal: Knowledge of condition will improve Admission education, safety and protocols reviewed with patient and significant other. Encouraged patient to call for assistance to get out of bed to the bathroom until she is steady on her feet.

## 2016-11-18 NOTE — Lactation Note (Signed)
This note was copied from a baby's chart. Lactation Consultation Note: This is mothers first baby.infant is 13 hours old. He has been breastfeeding on cue with latch scores of 7-8. Mother taught to hand express colostrum. Mother reports that she see some clear drops when hand expresses.  Assist mother with cross cradle hold. Infant latched on with good depth. Observed frequent suckling and a few swallows. Advised mother to use good breast support and pillow support. Encouraged to rotate between football and cross cradle. Mother to breastfeed on cue and at least 8-12 times in 24 hours. Encouraged frequent skin to skin. Mother was given [facility] brochure with information on all available LC services. Mother is aware that she can page for [facility] assistance and phone office 7 days a week when she goes home.  Patient Name: Shirley Hall [id number] Date: 11/18/2016 Reason for consult: Initial assessment   Maternal Data Has patient been taught Hand Expression?: Yes Does the patient have breastfeeding experience prior to this delivery?: No  Feeding Feeding Type: Breast Fed Length of feed: 20 min  LATCH Score Latch: Grasps breast easily, tongue down, lips flanged, rhythmical sucking.  Audible Swallowing: A few with stimulation  Type of Nipple: Everted at rest and after stimulation  Comfort (Breast/Nipple): Soft / non-tender  Hold (Positioning): Assistance needed to correctly position infant at breast and maintain latch.  LATCH Score: 8  Interventions Interventions: Breast feeding basics reviewed;Assisted with latch;Skin to skin;Breast massage;Hand express;Breast compression  Lactation Tools Discussed/Used     Consult Status Consult Status: Follow-up Date: 11/19/16 Follow-up type: In-patient    Stevan Born [facility] 11/18/2016, 2:44 PM

## 2016-11-18 NOTE — Progress Notes (Signed)
Vitals:   11/18/16 0400 11/18/16 0430  BP: (!) 100/55 (!) 111/53  Pulse: 76 100  Resp:    Temp:    SpO2: 99% 99%   cx 7/90/0. Pit at 6 mu/min.  Ctx still irregular, but are definitely working, so will leave pitocin at current rate.  FHR Cat 1

## 2016-11-19 LAB — BIRTH TISSUE RECOVERY COLLECTION (PLACENTA DONATION)

## 2016-11-19 NOTE — Progress Notes (Signed)
Post Partum Day 1 Subjective: no complaints, up ad lib, voiding, tolerating PO and + flatus  Objective: Blood pressure (!) 99/57, pulse 95, temperature 98.3 F (36.8 C), temperature source Oral, resp. rate 17, height 5\' 5"  (1.651 m), weight 72.6 kg (160 lb), last menstrual period 02/15/2016, SpO2 99 %, unknown if currently breastfeeding.  Physical Exam:  General: alert, cooperative and no distress Lochia: appropriate Uterine Fundus: firm Incision: n.a DVT Evaluation: No evidence of DVT seen on physical exam.   Recent Labs  11/17/16 1649  HGB 8.5*  HCT 27.5*    Assessment/Plan: Breastfeeding  Possible discharge later today or tomorrow based on baby's dispo   LOS: 2 days   [name] 11/19/2016, 7:52 AM   I confirm that I have verified the information documented in the resident's note and that I have also personally reperformed the physical exam and all medical decision making activities.  Luna Kitchens CNM

## 2016-11-19 NOTE — Lactation Note (Signed)
This note was copied from a baby's chart. Lactation Consultation Note  Patient Name: Shirley Hall [id number] Date: 11/19/2016 Reason for consult: Follow-up assessment Infant is 29 hours old & seen by [facility] for follow-up assessment. Baby was BF when LC entered but baby soon came off and started crying. FOB calmed baby and baby started rooting again so suggested mom try BF again. Mom was laid back and had baby at her right breast (drops of colostrum were noted when mom hand expressed before latching). Baby latched and suckled after a few attempts. A few swallows were noted but baby's latch became shallow after ~10 mins. LC showed how to apply slight pressure to baby's chin while drawing him closer to achieve a deep latch. After another 5 mins, baby unlatched and started crying again. Mom was able to calm baby after a few minutes and then he was in a peaceful state. Mom reports baby has been BF for ~10-15 mins mostly but did BF for 1hr on one breast once. Mom reports she has been latching baby more to her right breast because it leaked more during pregnancy. Mom encouraged to feed baby 8-12 times/24 hours and with feeding cues and to keep him actively suckling and swallowing for at least 15-30 mins on one breast before switching sides. Encouraged mom to offer both breasts at every feeding and alternate which breast she starts on at each feeding.  Mom reports only slight discomfort if he unlatches by pulling her nipple to the side. Mom reports she has WIC. Mom reports no questions at this time. Encouraged mom to call O/P number if she has questions later.    Maternal Data    Feeding Feeding Type: Breast Fed Length of feed: 15 min  LATCH Score Latch: Repeated attempts needed to sustain latch, nipple held in mouth throughout feeding, stimulation needed to elicit sucking reflex.  Audible Swallowing: A few with stimulation  Type of Nipple: Everted at rest and after stimulation  Comfort  (Breast/Nipple): Soft / non-tender  Hold (Positioning): No assistance needed to correctly position infant at breast.  LATCH Score: 8  Interventions Interventions: Breast feeding basics reviewed;Skin to skin;Breast compression;Position options  Lactation Tools Discussed/Used WIC Program: Yes   Consult Status Consult Status: Complete    Oneal Grout 11/19/2016, 11:47 AM

## 2016-11-19 NOTE — Discharge Instructions (Signed)

## 2016-11-19 NOTE — Discharge Summary (Signed)
Obstetric Discharge Summary Reason for Admission: onset of labor Prenatal Procedures: none Intrapartum Procedures: spontaneous vaginal delivery Postpartum Procedures: none Complications-Operative and Postpartum: shoulder dystocia Hemoglobin  Date Value Ref Range Status  11/17/2016 8.5 (L) 12.0 - 15.0 g/dL Final  04/88/8916 9.5 (L) 11.1 - 15.9 g/dL Final   HCT  Date Value Ref Range Status  11/17/2016 27.5 (L) 36.0 - 46.0 % Final   Hematocrit  Date Value Ref Range Status  08/18/2016 29.8 (L) 34.0 - 46.6 % Final    Physical Exam:  General: alert, cooperative and no distress Lochia: appropriate Uterine Fundus: firm Incision: n/a DVT Evaluation: Negative Homan's sign.  Discharge Diagnoses: Term Pregnancy-delivered  Discharge Information: Date: 11/19/2016 Activity: pelvic rest Diet: routine Medications: None Condition: stable Instructions: refer to practice specific booklet Discharge to: home Follow-up Information    [facility]. Schedule an appointment as soon as possible for a visit in 6 week(s).   Contact information: [address] [place] [address] [phone]          Newborn Data: Live born female  Birth Weight: 9 lb 2.2 oz (4145 g) APGAR: 7, 9  Home with mother.  [name] 11/19/2016, 7:54 AM   Midwife attestation I have seen and examined this patient and agree with above documentation in the resident's note.   Shirley Hall is a 21 y.o. G2P1011 s/p NSVD.   Pain is well controlled.  Plan for birth control is Nexplanon.  Method of Feeding: breast  PE:  BP (!) 99/57 (BP Location: Left Arm)   Pulse 95   Temp 98.3 F (36.8 C) (Oral)   Resp 17   Ht 5\' 5"  (1.651 m)   Wt 160 lb (72.6 kg)   LMP 02/15/2016   SpO2 99%   Breastfeeding? Unknown   BMI 26.63 kg/m  Gen: well appearing Heart: reg rate Lungs: normal WOB Fundus firm Ext: soft, no pain, no edema   Recent Labs  11/17/16 1649  HGB  8.5*  HCT 27.5*     Plan: discharge today - postpartum care discussed - f/u clinic in 6 weeks for postpartum visit   Sharen Counter, CNM 9:58 PM

## 2016-11-19 NOTE — Plan of Care (Signed)
Problem: Activity: Goal: Will verbalize the importance of balancing activity with adequate rest periods Outcome: Completed/Met Date Met: 11/19/16 Patient verbalized the importance of balancing activity with rest periods.

## 2016-11-21 LAB — TYPE AND SCREEN
ABO/RH(D): B POS
Antibody Screen: NEGATIVE
UNIT DIVISION: 0
Unit division: 0
Unit division: 0
Unit division: 0

## 2016-11-21 LAB — BPAM RBC
BLOOD PRODUCT EXPIRATION DATE: 201809052359
BLOOD PRODUCT EXPIRATION DATE: 201809152359
BLOOD PRODUCT EXPIRATION DATE: 201809152359
Blood Product Expiration Date: 201809052359
ISSUE DATE / TIME: 201808161247
ISSUE DATE / TIME: 201808161247
ISSUE DATE / TIME: 201808241517
UNIT TYPE AND RH: 5100
Unit Type and Rh: 5100
Unit Type and Rh: 5100
Unit Type and Rh: 5100

## 2016-11-22 ENCOUNTER — Encounter: Payer: Self-pay | Admitting: Certified Nurse Midwife

## 2016-11-29 ENCOUNTER — Inpatient Hospital Stay (HOSPITAL_COMMUNITY): Admission: RE | Admit: 2016-11-29 | Payer: Medicaid Other | Source: Ambulatory Visit

## 2016-12-21 ENCOUNTER — Ambulatory Visit (INDEPENDENT_AMBULATORY_CARE_PROVIDER_SITE_OTHER): Payer: Medicaid Other | Admitting: Obstetrics & Gynecology

## 2016-12-21 ENCOUNTER — Encounter: Payer: Self-pay | Admitting: Obstetrics & Gynecology

## 2016-12-21 DIAGNOSIS — Z3049 Encounter for surveillance of other contraceptives: Secondary | ICD-10-CM

## 2016-12-21 DIAGNOSIS — Z30017 Encounter for initial prescription of implantable subdermal contraceptive: Secondary | ICD-10-CM

## 2016-12-21 DIAGNOSIS — Z3202 Encounter for pregnancy test, result negative: Secondary | ICD-10-CM

## 2016-12-21 LAB — POCT URINE PREGNANCY: Preg Test, Ur: NEGATIVE

## 2016-12-21 MED ORDER — ETONOGESTREL 68 MG ~~LOC~~ IMPL
68.0000 mg | DRUG_IMPLANT | Freq: Once | SUBCUTANEOUS | Status: AC
  Administered 2016-12-21: 68 mg via SUBCUTANEOUS

## 2016-12-21 NOTE — Patient Instructions (Signed)
Etonogestrel implant What is this medicine? ETONOGESTREL (et oh noe JES trel) is a contraceptive (birth control) device. It is used to prevent pregnancy. It can be used for up to 3 years. This medicine may be used for other purposes; ask your health care provider or pharmacist if you have questions. COMMON BRAND NAME(S): Implanon, Nexplanon What should I tell my health care provider before I take this medicine? They need to know if you have any of these conditions: -abnormal vaginal bleeding -blood vessel disease or blood clots -cancer of the breast, cervix, or liver -depression -diabetes -gallbladder disease -headaches -heart disease or recent heart attack -high blood pressure -high cholesterol -kidney disease -liver disease -renal disease -seizures -tobacco smoker -an unusual or allergic reaction to etonogestrel, other hormones, anesthetics or antiseptics, medicines, foods, dyes, or preservatives -pregnant or trying to get pregnant -breast-feeding How should I use this medicine? This device is inserted just under the skin on the inner side of your upper arm by a health care professional. Talk to your pediatrician regarding the use of this medicine in children. Special care may be needed. Overdosage: If you think you have taken too much of this medicine contact a poison control center or emergency room at once. NOTE: This medicine is only for you. Do not share this medicine with others. What if I miss a dose? This does not apply. What may interact with this medicine? Do not take this medicine with any of the following medications: -amprenavir -bosentan -fosamprenavir This medicine may also interact with the following medications: -barbiturate medicines for inducing sleep or treating seizures -certain medicines for fungal infections like ketoconazole and itraconazole -grapefruit juice -griseofulvin -medicines to treat seizures like carbamazepine, felbamate, oxcarbazepine,  phenytoin, topiramate -modafinil -phenylbutazone -rifampin -rufinamide -some medicines to treat HIV infection like atazanavir, indinavir, lopinavir, nelfinavir, tipranavir, ritonavir -St. John's wort This list may not describe all possible interactions. Give your health care provider a list of all the medicines, herbs, non-prescription drugs, or dietary supplements you use. Also tell them if you smoke, drink alcohol, or use illegal drugs. Some items may interact with your medicine. What should I watch for while using this medicine? This product does not protect you against HIV infection (AIDS) or other sexually transmitted diseases. You should be able to feel the implant by pressing your fingertips over the skin where it was inserted. Contact your doctor if you cannot feel the implant, and use a non-hormonal birth control method (such as condoms) until your doctor confirms that the implant is in place. If you feel that the implant may have broken or become bent while in your arm, contact your healthcare provider. What side effects may I notice from receiving this medicine? Side effects that you should report to your doctor or health care professional as soon as possible: -allergic reactions like skin rash, itching or hives, swelling of the face, lips, or tongue -breast lumps -changes in emotions or moods -depressed mood -heavy or prolonged menstrual bleeding -pain, irritation, swelling, or bruising at the insertion site -scar at site of insertion -signs of infection at the insertion site such as fever, and skin redness, pain or discharge -signs of pregnancy -signs and symptoms of a blood clot such as breathing problems; changes in vision; chest pain; severe, sudden headache; pain, swelling, warmth in the leg; trouble speaking; sudden numbness or weakness of the face, arm or leg -signs and symptoms of liver injury like dark yellow or brown urine; general ill feeling or flu-like symptoms;  light-colored   stools; loss of appetite; nausea; right upper belly pain; unusually weak or tired; yellowing of the eyes or skin -unusual vaginal bleeding, discharge -signs and symptoms of a stroke like changes in vision; confusion; trouble speaking or understanding; severe headaches; sudden numbness or weakness of the face, arm or leg; trouble walking; dizziness; loss of balance or coordination Side effects that usually do not require medical attention (report to your doctor or health care professional if they continue or are bothersome): -acne -back pain -breast pain -changes in weight -dizziness -general ill feeling or flu-like symptoms -headache -irregular menstrual bleeding -nausea -sore throat -vaginal irritation or inflammation This list may not describe all possible side effects. Call your doctor for medical advice about side effects. You may report side effects to FDA at 1-800-FDA-1088. Where should I keep my medicine? This drug is given in a hospital or clinic and will not be stored at home. NOTE: This sheet is a summary. It may not cover all possible information. If you have questions about this medicine, talk to your doctor, pharmacist, or health care provider.  2018 Elsevier/Gold Standard (2015-10-01 11:19:22)  

## 2016-12-21 NOTE — Progress Notes (Signed)
Post Partum Exam  Shirley Hall is a 21 y.o. G1P1011 female who presents for a postpartum visit. She is 4 weeks postpartum following a spontaneous vaginal delivery. I have fully reviewed the prenatal and intrapartum course. The delivery was at [redacted]w[redacted]d gestational weeks.  Anesthesia: epidural. Postpartum course has been unremarkable. Baby's course has been unremarkable. Baby is feeding by breast. Bleeding staining only. Bowel function is normal. Bladder function is normal. Patient is not sexually active. Contraception method is none. Postpartum depression screening:neg. EPDS: 4  The following portions of the patient's history were reviewed and updated as appropriate: allergies, current medications, past family history, past medical history, past social history, past surgical history and problem list.  Review of Systems Pertinent items are noted in HPI.    Objective:  unknown if currently breastfeeding.  General:  alert, cooperative and no distress           Abdomen: soft, non-tender; bowel sounds normal; no masses,  no organomegaly   Vulva:  not evaluated  Vagina: not evaluated                    Assessment:    normal postpartum exam. Pap smear deferred until age 63.  Plan:   1. Contraception: Nexplanon 2. Procedure done today 3. Follow up in: 1 year or as needed.   Adam Phenix, MD 12/21/2016

## 2016-12-21 NOTE — Procedures (Signed)
GYNECOLOGY OFFICE PROCEDURE NOTE  Shirley Hall is a 21 y.o. G2P1011 here for Nexplanon insertion. Last intercourse was before delivery  Nexplanon Insertion Procedure Patient identified, informed consent performed, consent signed.   Patient does understand that irregular bleeding is a very common side effect of this medication. She was advised to have backup contraception for one week after placement. Pregnancy test in clinic today was negative.  Appropriate time out taken.  Patient's left arm was prepped and draped in the usual sterile fashion. The ruler used to measure and mark insertion area.  Patient was prepped with alcohol swab and then injected with 3 ml of 1% lidocaine.  She was prepped with betadine, Nexplanon removed from packaging,  Device confirmed in needle, then inserted full length of needle and withdrawn per handbook instructions. Nexplanon was able to palpated in the patient's arm; patient palpated the insert herself. There was minimal blood loss.  Patient insertion site covered with gauze and a pressure bandage to reduce any bruising.  The patient tolerated the procedure well and was given post procedure instructions.   Adam Phenix, MD Attending Obstetrician & Gynecologist, [facility]  12/21/2016

## 2016-12-28 ENCOUNTER — Encounter: Payer: Self-pay | Admitting: *Deleted

## 2017-04-17 IMAGING — US US OB LIMITED
1 series · 14 of 28 positions shown · non-contrast
Comparison: none

CLINICAL DATA: Acute onset of vaginal bleeding.  Initial encounter.

EXAM:
LIMITED OBSTETRIC ULTRASOUND

[Series 1: us ob limited · 0.23mm/px · 14 of 28 slices shown]
[im 2/28]
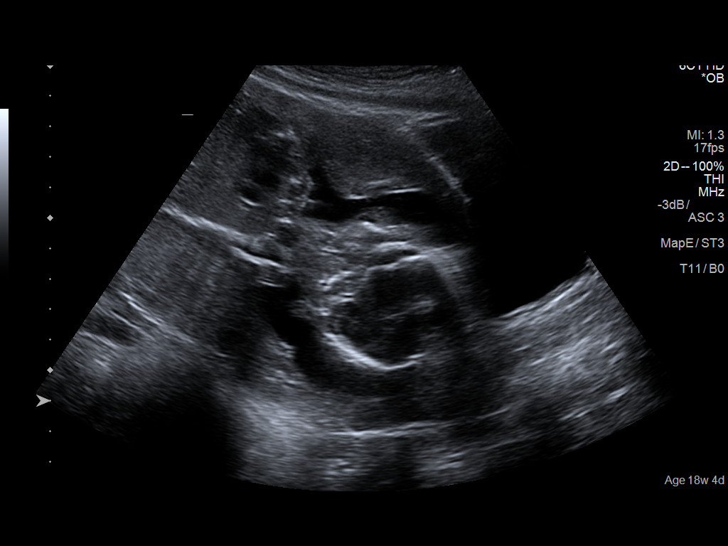
[im 4/28]
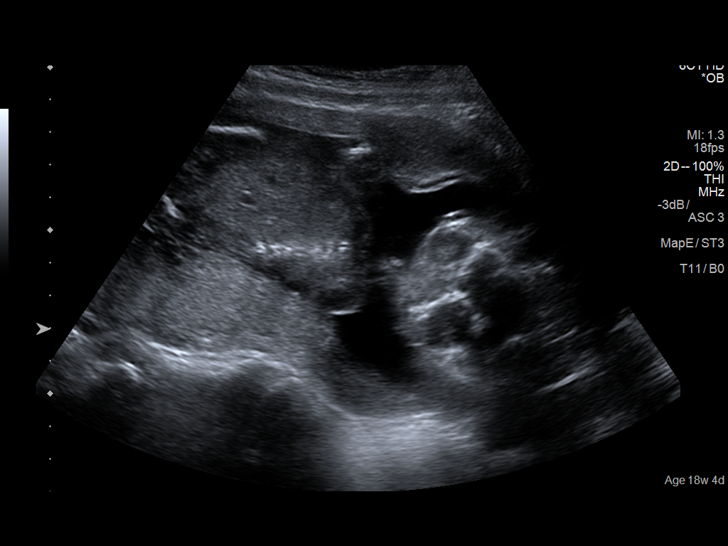
[im 6/28]
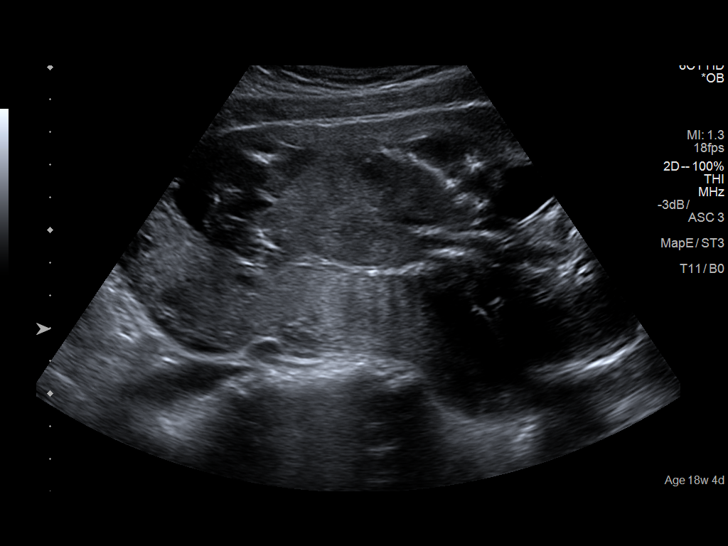
[im 8/28]
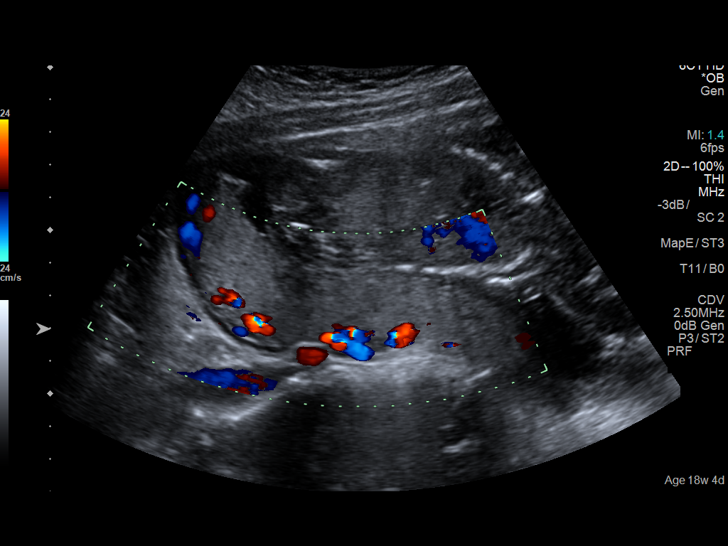
[im 10/28]
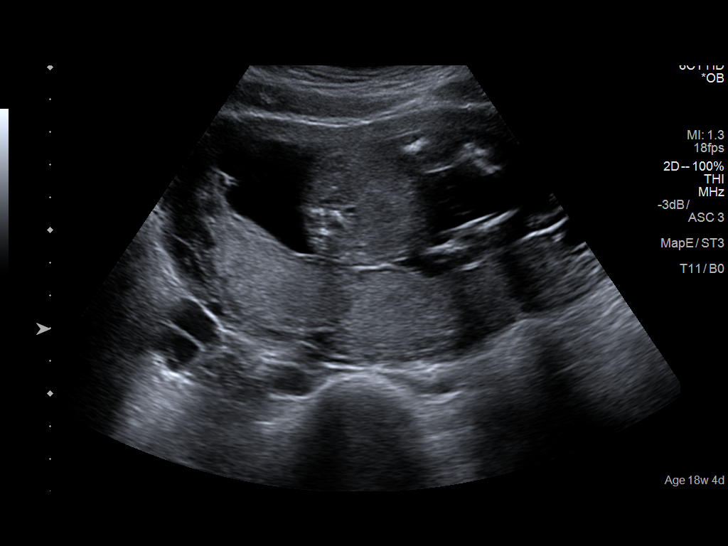
[im 12/28]
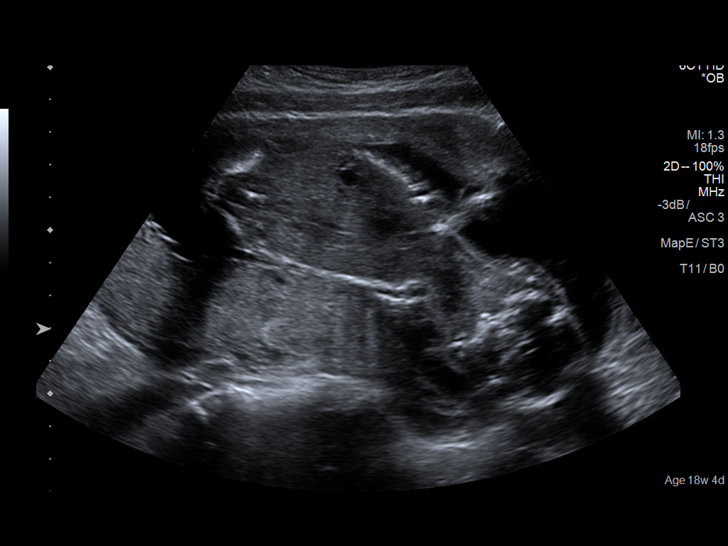
[im 14/28]
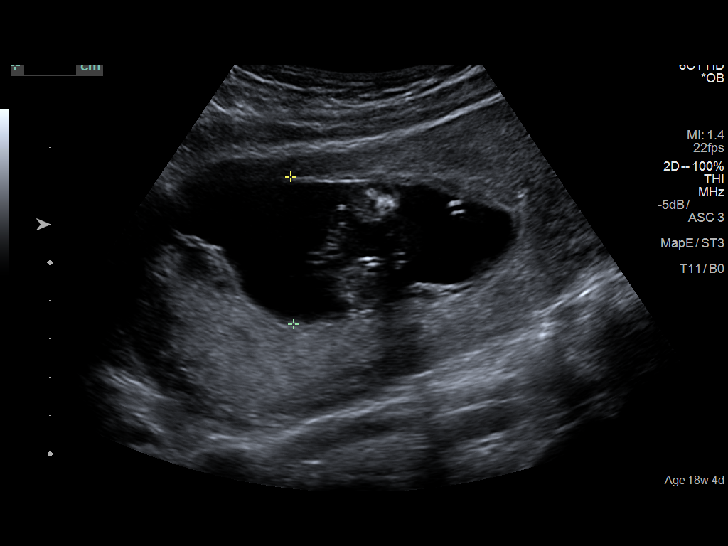
[im 16/28]
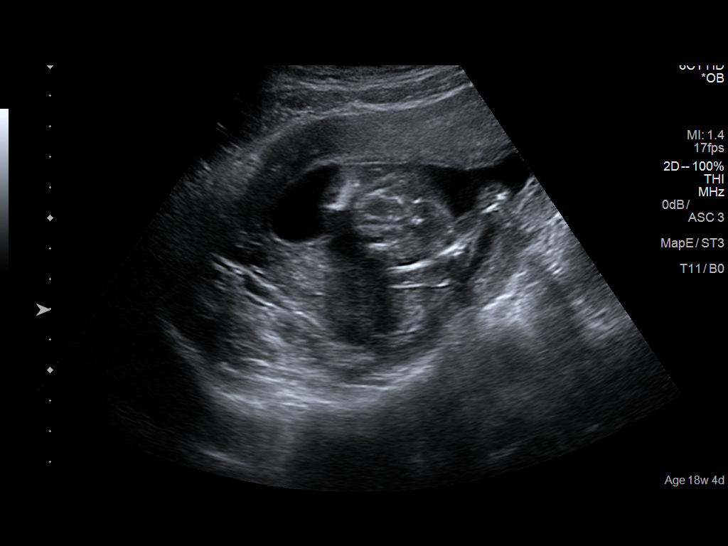
[im 18/28]
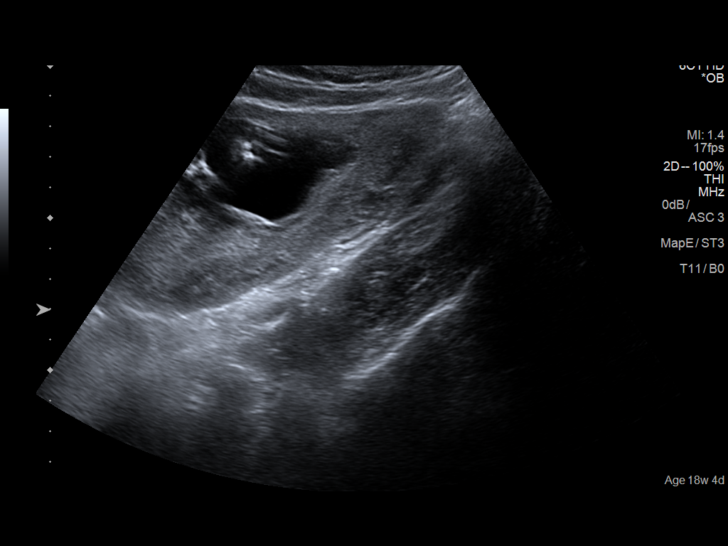
[im 20/28]
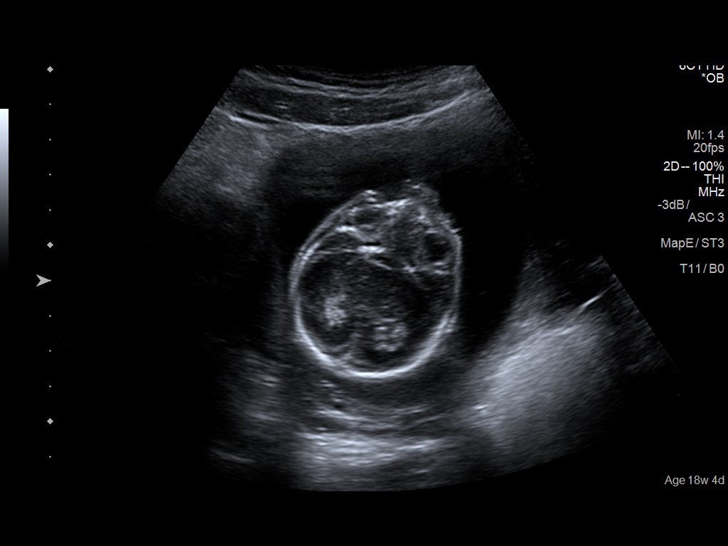
[im 22/28]
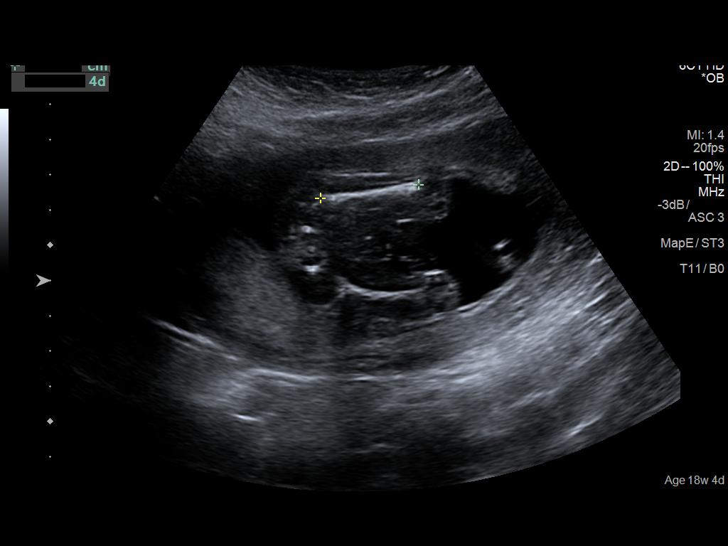
[im 24/28]
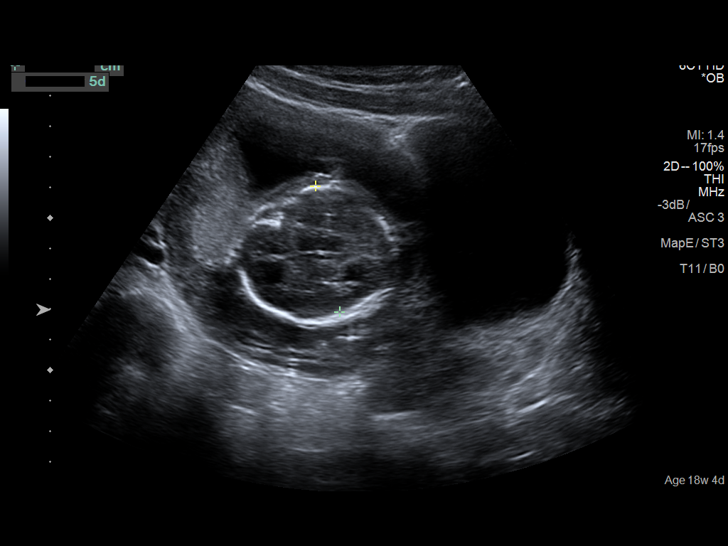
[im 26/28]
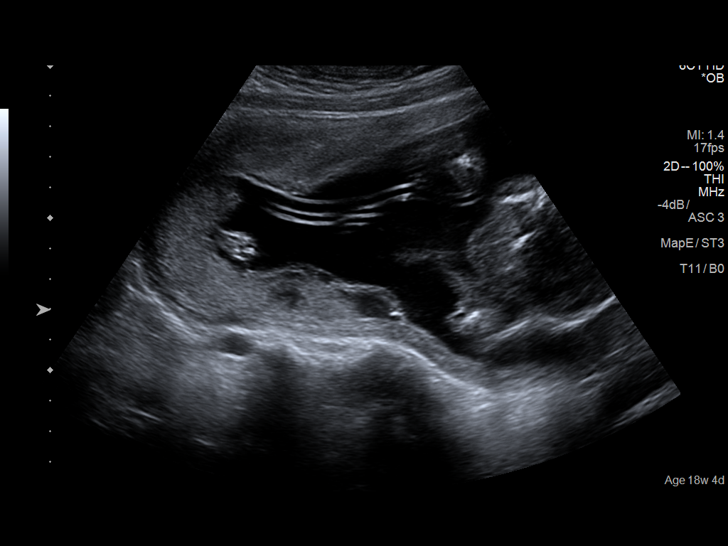
[im 28/28]
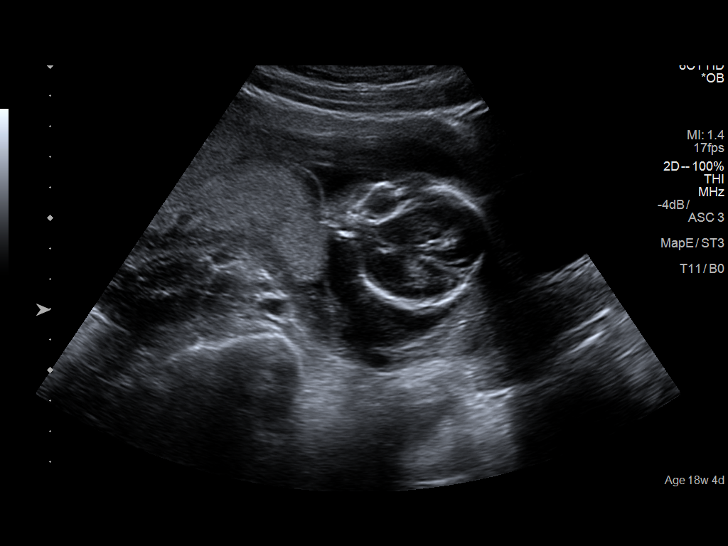

[14 of 28 positions shown; findings below may reference images not displayed]

FINDINGS: Number of Fetuses: 1

Heart Rate:  137 bpm

Movement: Yes

Presentation: Cephalic

Placental Location: Posterior

Previa: No

Amniotic Fluid (Subjective):  Within normal limits.

BPD:  2.8 cm       18 w 5 d

MATERNAL FINDINGS:

Cervix:  Appears closed.

Uterus/Adnexae:  No abnormality visualized.
IMPRESSION: Single live intrauterine pregnancy noted, with a biparietal diameter
of 2.8 cm, corresponding to a gestational age of 18 weeks 5 days.
This matches the gestational age by LMP of 18 weeks 4 days,
reflecting an estimated date of delivery November 21, 2016. No
evidence of placenta previa. The cervix remains closed.

This exam is performed on an emergent basis and does not
comprehensively evaluate fetal size, dating, or anatomy; follow-up
complete OB US should be considered if further fetal assessment is
warranted.

## 2017-09-13 IMAGING — US US OB COMP LESS 14 WK
1 series · 15 of 28 positions shown · non-contrast
Comparison: None.

CLINICAL DATA: Vaginal bleeding in cramping.

EXAM:
OBSTETRIC <14 WK US AND TRANSVAGINAL OB US
TECHNIQUE: Both transabdominal and transvaginal ultrasound examinations were
performed for complete evaluation of the gestation as well as the
maternal uterus, adnexal regions, and pelvic cul-de-sac.
Transvaginal technique was performed to assess early pregnancy.

[Series 1: us ob comp less 14 wk · 69 acquisitions, 15 frames shown]
[im 1/69]
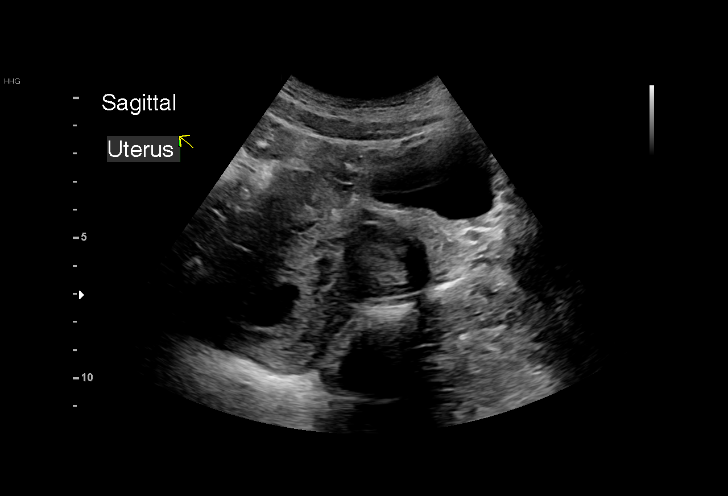
[im 6/69]
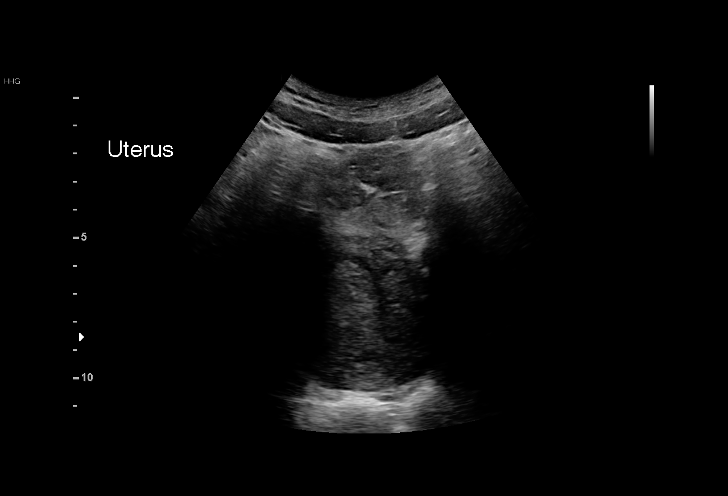
[im 11/69]
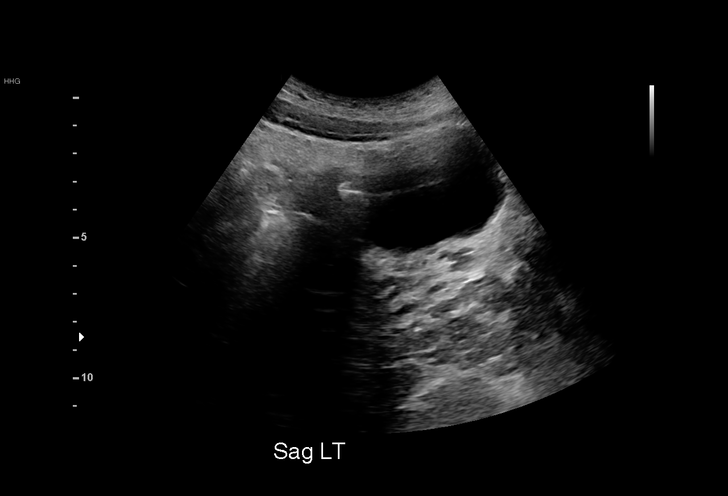
[im 16/69]
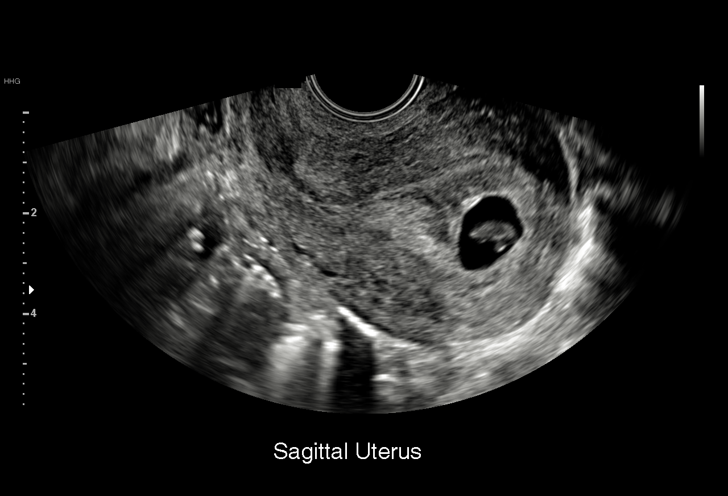
[im 21/69]
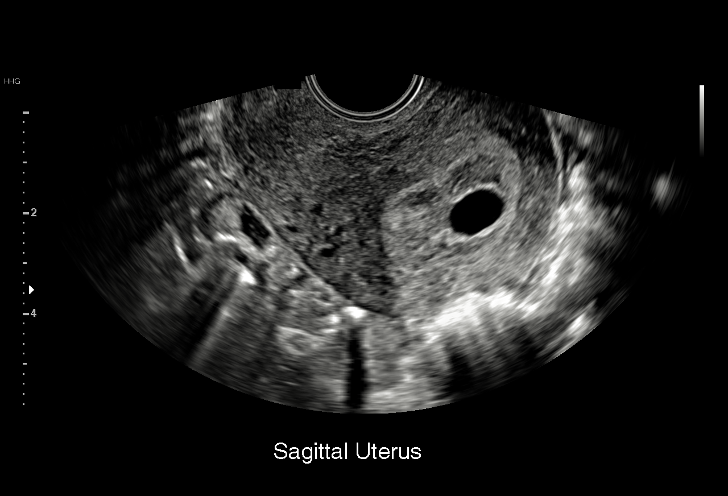
[im 26/69]
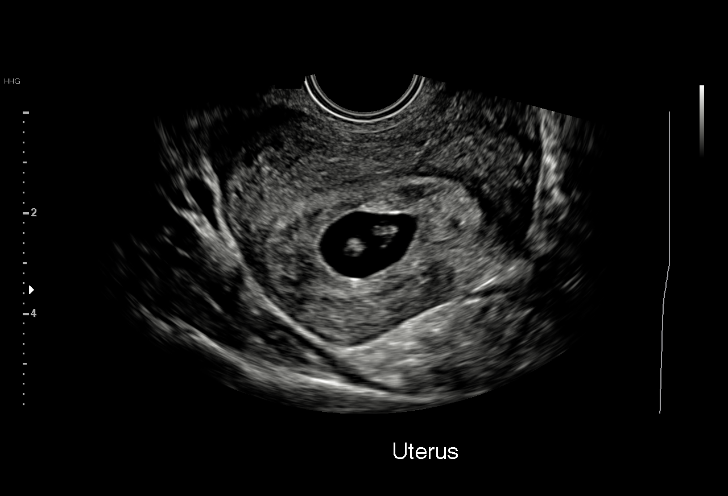
[im 31/69]
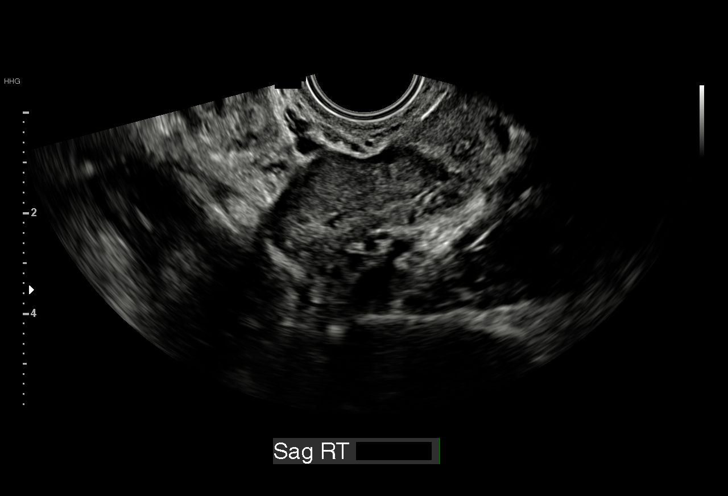
[im 36/69]
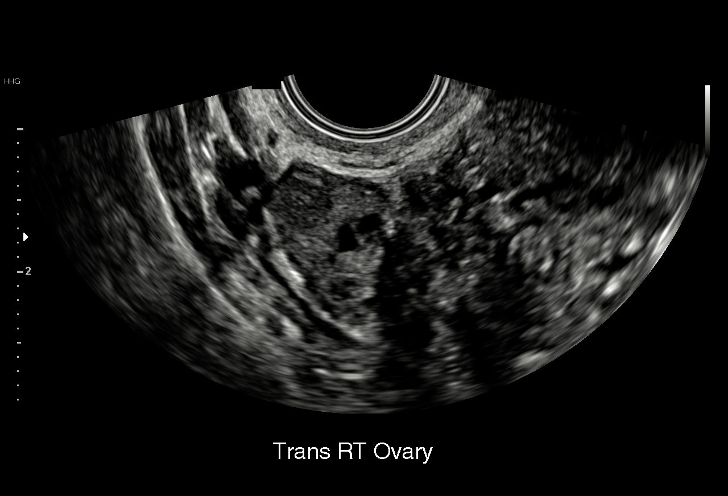
[im 38/69]
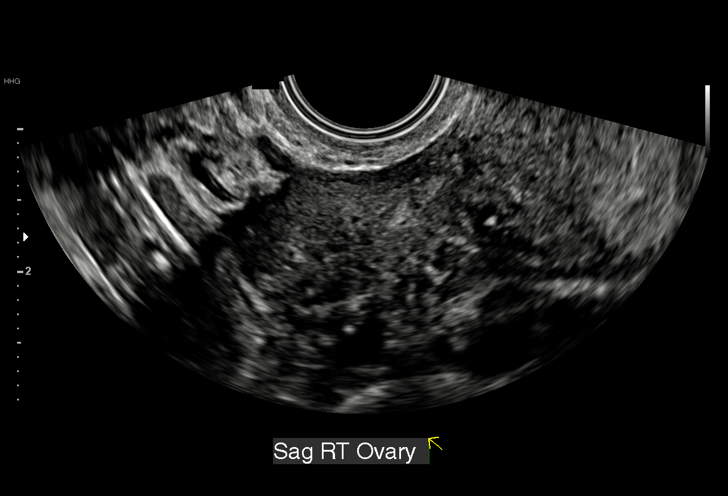
[im 43/69]
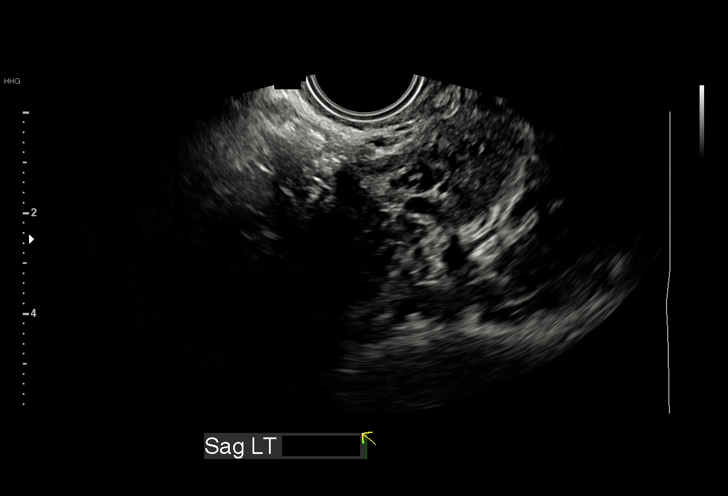
[im 48/69]
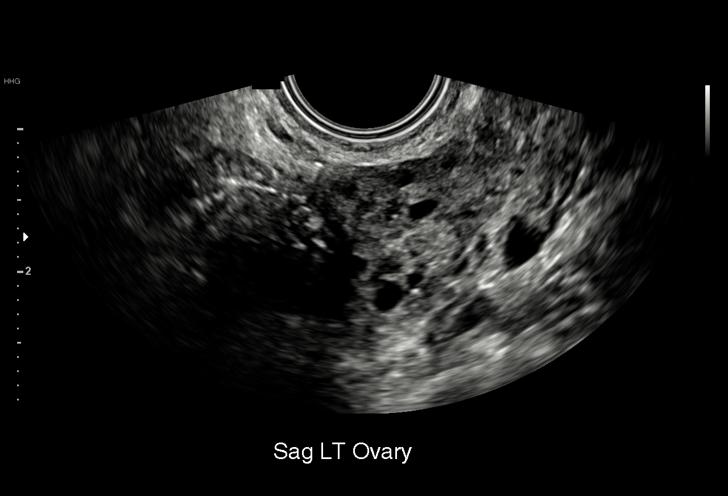
[im 53/69]
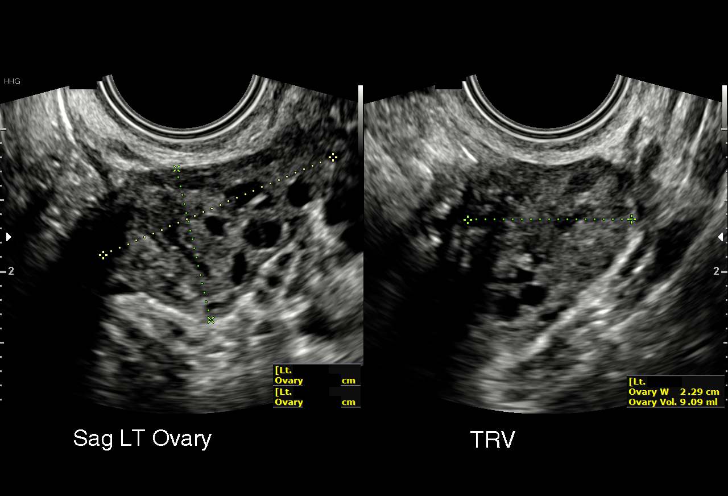
[im 58/69]
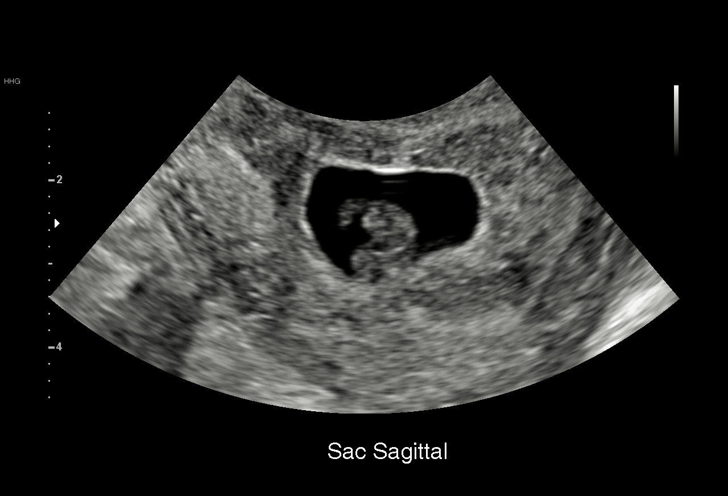
[im 63/69]
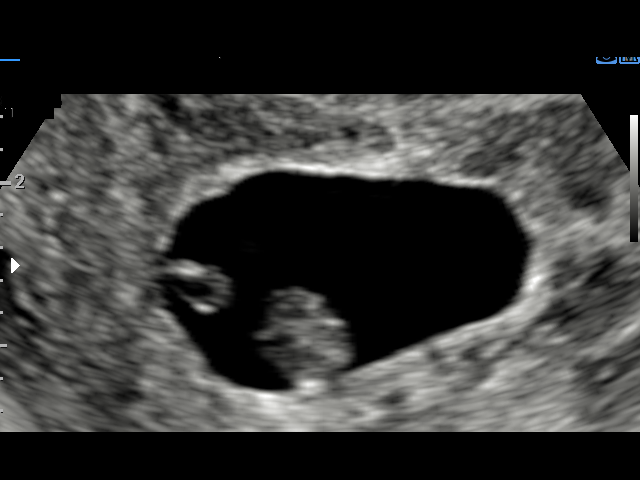
[im 69/69]
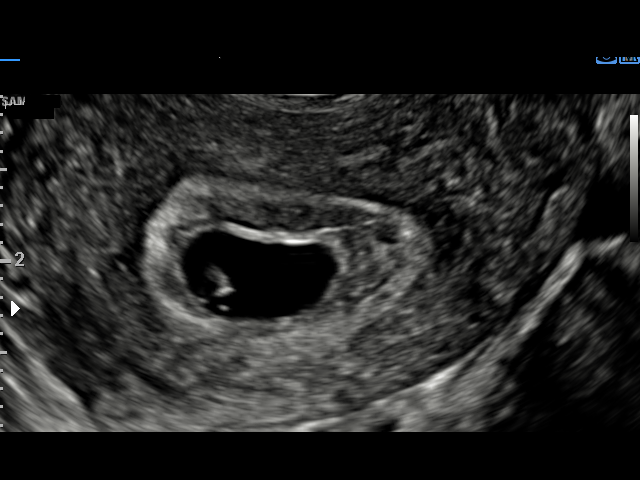

[15 of 28 positions shown; findings below may reference images not displayed]

FINDINGS: Intrauterine gestational sac: Yes, single

Yolk sac:  Yes

Embryo:  Yes

Cardiac Activity: No

CRL:  10.6  mm   7 w   1 d                  US EDC: 08/04/2016

Maternal uterus/adnexae:

Subchorionic hemorrhage: Small

Right ovary: Normal

Left ovary: Normal

Other :None

Free fluid:  None
IMPRESSION: 1. Single intrauterine gestation. No fetal cardiac activity
identified. Findings meet definitive criteria for failed pregnancy.
This follows SRU consensus guidelines: Diagnostic Criteria for
Nonviable Pregnancy Early in the First Trimester. N Engl J Med

## 2017-12-07 ENCOUNTER — Ambulatory Visit (HOSPITAL_COMMUNITY)
Admission: EM | Admit: 2017-12-07 | Discharge: 2017-12-07 | Disposition: A | Discharge status: Home / Self Care | Payer: Medicaid Other | Attending: Family Medicine | Admitting: Family Medicine

## 2017-12-07 ENCOUNTER — Encounter (HOSPITAL_COMMUNITY): Payer: Self-pay | Admitting: Emergency Medicine

## 2017-12-07 DIAGNOSIS — M545 Low back pain, unspecified: Secondary | ICD-10-CM

## 2017-12-07 DIAGNOSIS — Z3202 Encounter for pregnancy test, result negative: Secondary | ICD-10-CM

## 2017-12-07 DIAGNOSIS — Z79899 Other long term (current) drug therapy: Secondary | ICD-10-CM | POA: Insufficient documentation

## 2017-12-07 DIAGNOSIS — N10 Acute tubulo-interstitial nephritis: Secondary | ICD-10-CM

## 2017-12-07 LAB — POCT URINALYSIS DIP (DEVICE)
Glucose, UA: 100 mg/dL — AB
KETONES UR: 40 mg/dL — AB
NITRITE: POSITIVE — AB
Protein, ur: 100 mg/dL — AB
SPECIFIC GRAVITY, URINE: 1.02 (ref 1.005–1.030)
Urobilinogen, UA: 2 mg/dL — ABNORMAL HIGH (ref 0.0–1.0)
pH: 6 (ref 5.0–8.0)

## 2017-12-07 LAB — POCT PREGNANCY, URINE: Preg Test, Ur: NEGATIVE

## 2017-12-07 MED ORDER — CIPROFLOXACIN HCL 500 MG PO TABS: 500.0000 mg | ORAL_TABLET | Freq: Two times a day (BID) | ORAL | 0 refills | Status: AC

## 2017-12-07 MED ORDER — CEFTRIAXONE SODIUM 1 G IJ SOLR
1.0000 g | Freq: Once | INTRAMUSCULAR | Status: AC
  Administered 2017-12-07: 1 g via INTRAMUSCULAR

## 2017-12-07 MED ORDER — LIDOCAINE HCL (PF) 1 % IJ SOLN
INTRAMUSCULAR | Status: AC
  Filled 2017-12-07: qty 2

## 2017-12-07 MED ORDER — CEFTRIAXONE SODIUM 1 G IJ SOLR
INTRAMUSCULAR | Status: AC
  Filled 2017-12-07: qty 10

## 2017-12-07 NOTE — Discharge Instructions (Signed)
Urine showed evidence of infection. We gave you a shot of rocephin in clinic today, We are treating you with cipro- twice daily for 5 days. Be sure to take full course. Stay hydrated- urine should be pale yellow to clear. May continue azo for relief of burning while infection is being cleared.   Please return or follow up with your primary provider if symptoms not improving with treatment. Please return sooner if you have worsening of symptoms or develop fever, nausea, vomiting, abdominal pain, back pain, lightheadedness, dizziness.

## 2017-12-07 NOTE — ED Triage Notes (Signed)
Pt sts left sided back pain with vomiting x 1 last night

## 2017-12-07 NOTE — ED Provider Notes (Signed)
[facility]    CSN: [id number] Arrival date & time: 12/07/17  1106     History   Chief Complaint Chief Complaint  Patient presents with  . Back Pain    HPI Shirley Hall is a 22 y.o. female no significant past medical history presenting today for evaluation of back pain and vomiting.  Patient states that over the past week she was having dysuria and increased frequency, she took cranberry supplements which resolved the symptoms.  3 days ago she started to develop the back pain, last night her back pain significantly worsened and led to vomiting.  She is also been endorsing cold chills and subjective fevers.  Any medicines for symptoms.  Back pain is mainly located on the left side and worsens with taking a deep breath.  Denies any specific injury or increase in activity.  HPI  Past Medical History:  Diagnosis Date  . Anemia   . Pregnant     There are no active problems to display for this patient.   Past Surgical History:  Procedure Laterality Date  . NO PAST SURGERIES      OB History    Gravida  2   Para  1   Term  1   Preterm      AB  1   Living  1     SAB  1   TAB      Ectopic      Multiple  0   Live Births  1            Home Medications    Prior to Admission medications   Medication Sig Start Date End Date Taking? Authorizing Provider  ciprofloxacin (CIPRO) 500 MG tablet Take 1 tablet (500 mg total) by mouth every 12 (twelve) hours for 5 days. 12/07/17 12/12/17  Alona Danford C, PA-C  Prenatal Vit-Fe Fumarate-FA (PRENATAL MULTIVITAMIN) TABS tablet Take 1 tablet by mouth daily at 12 noon.    [provider]    Family History History reviewed. No pertinent family history.  Social History Social History   Tobacco Use  . Smoking status: Never Smoker  . Smokeless tobacco: Never Used  Substance Use Topics  . Alcohol use: No  . Drug use: No     Allergies   Amoxicillin   Review of Systems Review of Systems   Constitutional: Negative for fever.  Respiratory: Negative for shortness of breath.   Cardiovascular: Negative for chest pain.  Gastrointestinal: Positive for nausea and vomiting. Negative for abdominal pain and diarrhea.  Genitourinary: Positive for dysuria and flank pain. Negative for genital sores, hematuria, menstrual problem, vaginal bleeding, vaginal discharge and vaginal pain.  Musculoskeletal: Positive for back pain.  Skin: Negative for rash.  Neurological: Negative for dizziness, light-headedness and headaches.     Physical Exam Triage Vital Signs ED Triage Vitals [12/07/17 1137]  Enc Vitals Group     BP (!) 96/48     Pulse Rate (!) 117     Resp 18     Temp 98.5 F (36.9 C)     Temp Source Oral     SpO2 100 %     Weight      Height      Head Circumference      Peak Flow      Pain Score      Pain Loc      Pain Edu?      Excl. in GC?    No data found.  Updated Vital Signs BP (!) 96/48 (BP Location: Left Arm)   Pulse (!) 117   Temp 98.5 F (36.9 C) (Oral)   Resp 18   SpO2 100%  Heart rate rechecked manually, 112 Visual Acuity Right Eye Distance:   Left Eye Distance:   Bilateral Distance:    Right Eye Near:   Left Eye Near:    Bilateral Near:     Physical Exam  Constitutional: She appears well-developed and well-nourished. No distress.  HENT:  Head: Normocephalic and atraumatic.  Eyes: Conjunctivae are normal.  Neck: Neck supple.  Cardiovascular: Regular rhythm.  No murmur heard. Tachycardic  Pulmonary/Chest: Effort normal and breath sounds normal. No respiratory distress.  Abdominal: Soft. There is no tenderness.  Positive CVA tenderness on left  Musculoskeletal: She exhibits no edema.  Neurological: She is alert.  Skin: Skin is warm and dry.  Psychiatric: She has a normal mood and affect.  Nursing note and vitals reviewed.    UC Treatments / Results  Labs (all labs ordered are listed, but only abnormal results are displayed) Labs  Reviewed  POCT URINALYSIS DIP (DEVICE) - Abnormal; Notable for the following components:      Result Value   Glucose, UA 100 (*)    Bilirubin Urine SMALL (*)    Ketones, ur 40 (*)    Hgb urine dipstick LARGE (*)    Protein, ur 100 (*)    Urobilinogen, UA 2.0 (*)    Nitrite POSITIVE (*)    Leukocytes, UA LARGE (*)    All other components within normal limits  URINE CULTURE  POCT PREGNANCY, URINE    EKG None  Radiology No results found.  Procedures Procedures (including critical care time)  Medications Ordered in UC Medications  cefTRIAXone (ROCEPHIN) injection 1 g (has no administration in time range)    Initial Impression / Assessment and Plan / UC Course  I have reviewed the triage vital signs and the nursing notes.  Pertinent labs & imaging results that were available during my care of the patient were reviewed by me and considered in my medical decision making (see chart for details).     Positive nitrites, large leuks, tachycardic with vomiting, will treat for pyelonephritis, will provide Rocephin 1 g prior to discharge, will send home with Cipro to take twice daily for 5 days.  Tylenol and ibuprofen for fever and pain.Discussed strict return precautions. Patient verbalized understanding and is agreeable with plan.  Final Clinical Impressions(s) / UC Diagnoses   Final diagnoses:  Acute left-sided low back pain without sciatica  Acute pyelonephritis     Discharge Instructions     Urine showed evidence of infection. We gave you a shot of rocephin in clinic today, We are treating you with cipro- twice daily for 5 days. Be sure to take full course. Stay hydrated- urine should be pale yellow to clear. May continue azo for relief of burning while infection is being cleared.   Please return or follow up with your primary provider if symptoms not improving with treatment. Please return sooner if you have worsening of symptoms or develop fever, nausea, vomiting,  abdominal pain, back pain, lightheadedness, dizziness.   ED Prescriptions    Medication Sig Dispense Auth. Provider   ciprofloxacin (CIPRO) 500 MG tablet Take 1 tablet (500 mg total) by mouth every 12 (twelve) hours for 5 days. 10 tablet Avalene Sealy, SteinauerHallie C, PA-C     Controlled Substance Prescriptions [place] Controlled Substance Registry consulted? Not Applicable   Sharyon CableWieters,  Keltin Baird C, PA-C 12/07/17 1216

## 2017-12-09 LAB — URINE CULTURE: Culture: 80000 — AB

## 2017-12-11 ENCOUNTER — Telehealth (HOSPITAL_COMMUNITY): Payer: Self-pay

## 2017-12-11 NOTE — Telephone Encounter (Signed)
Urine culture positive for e.coli. Pt was treated with cipro at [facility] visit. Pt called and made aware.

## 2018-03-14 IMAGING — US US MFM OB LIMITED
1 series · 15 of 20 positions shown · non-contrast
Comparison: none

[Series 2: us mfm ob limited · 15 of 20 slices shown]
[im 1/20]
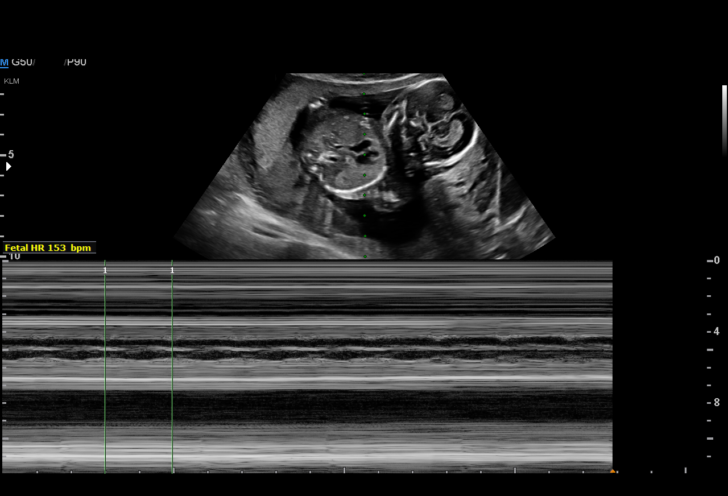
[im 3/20]
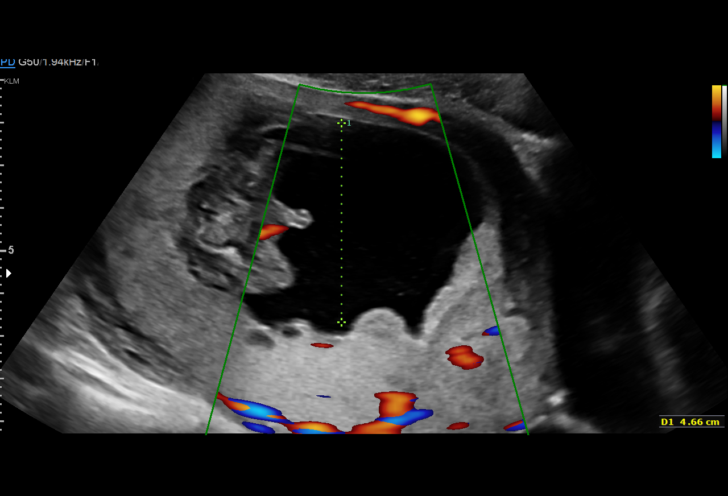
[im 4/20]
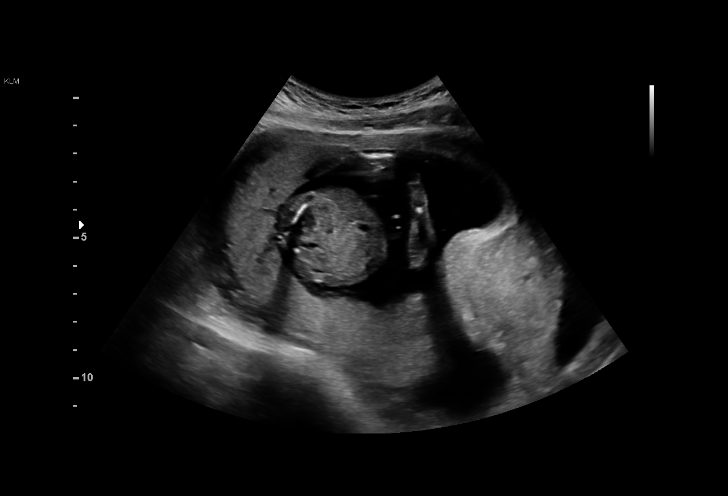
[im 5/20]
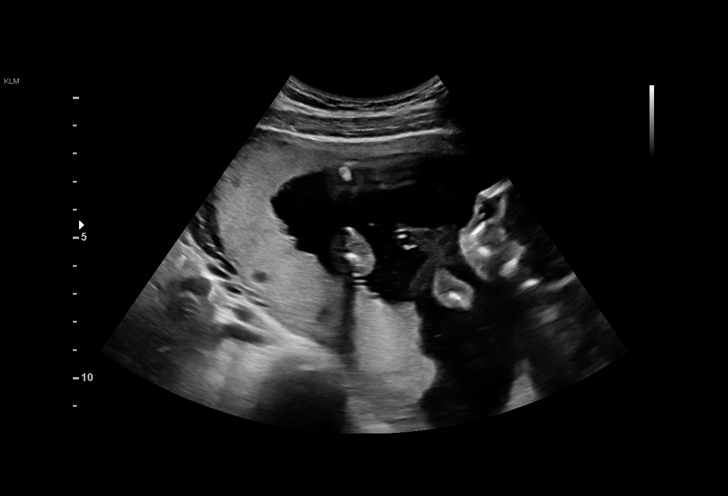
[im 7/20]
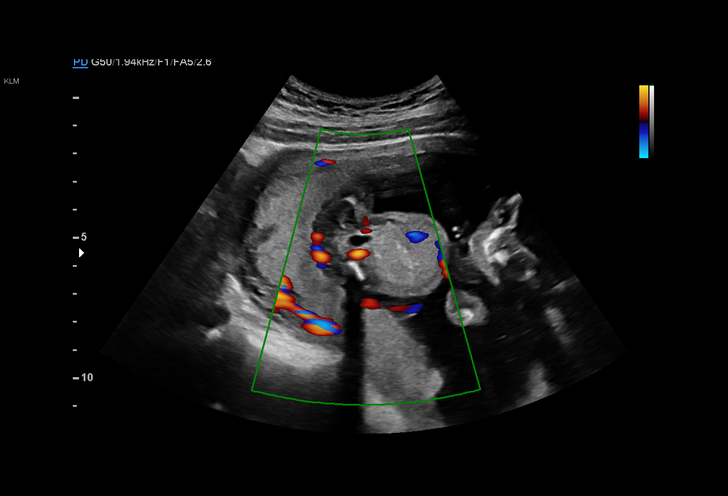
[im 8/20]
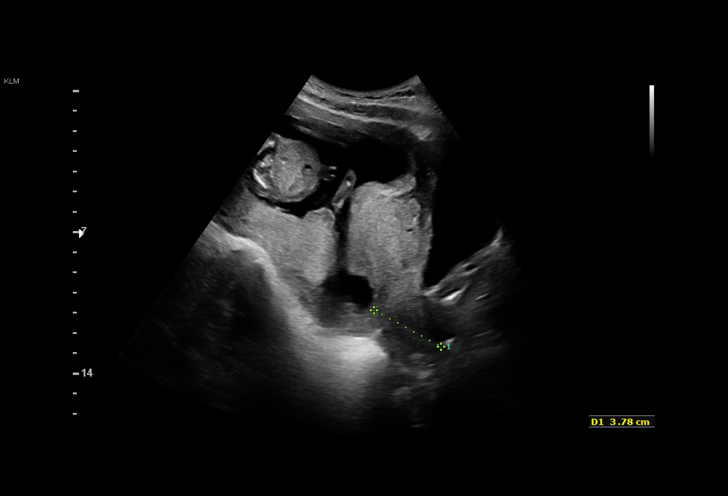
[im 9/20]
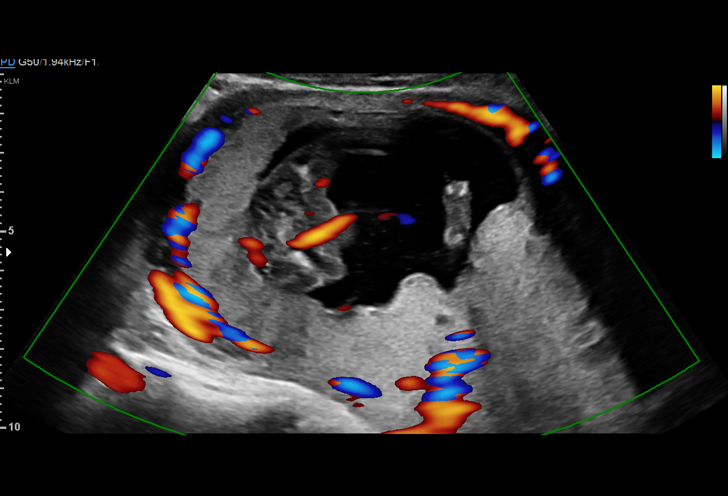
[im 11/20]
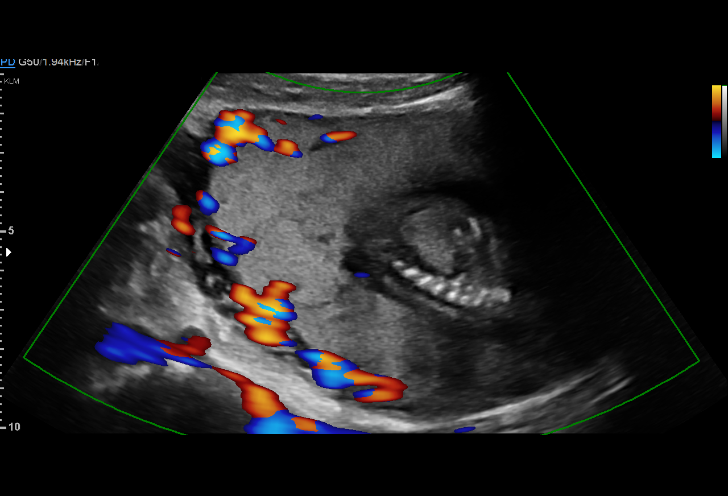
[im 12/20]
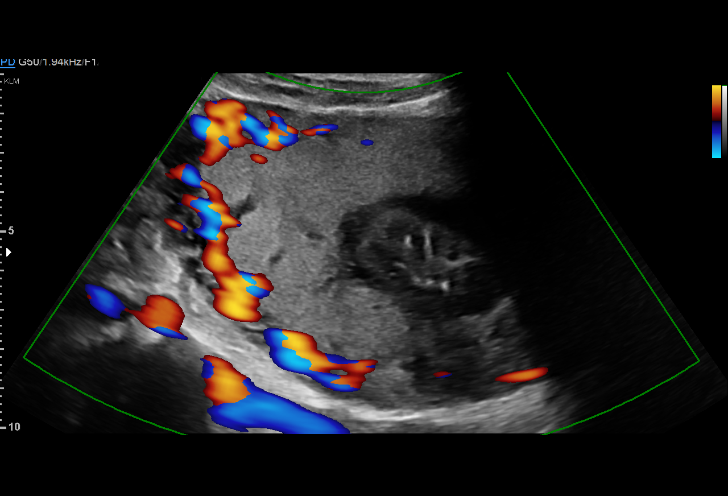
[im 13/20]
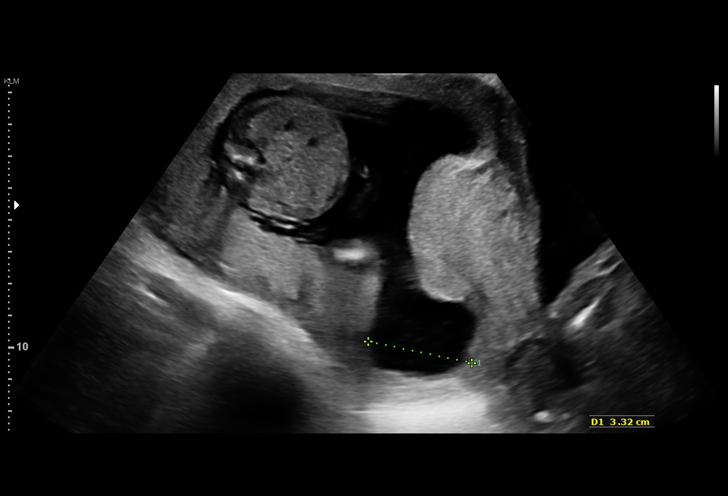
[im 15/20]
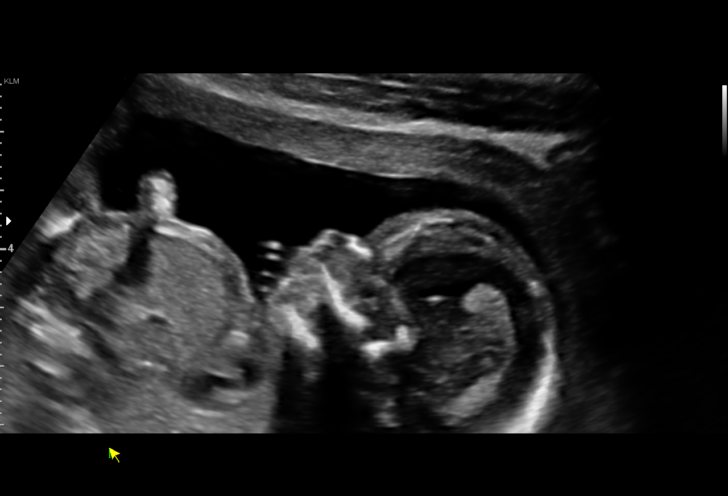
[im 16/20]
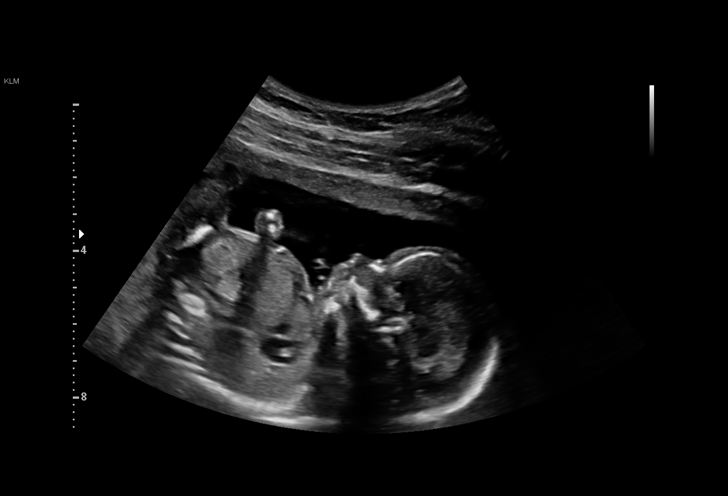
[im 17/20]
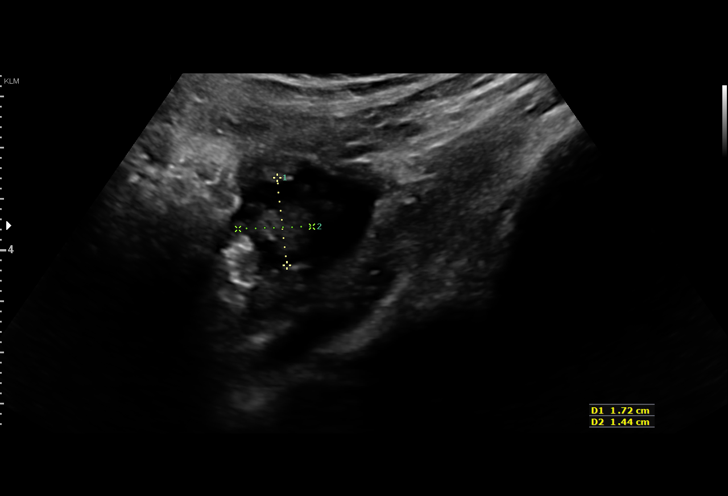
[im 19/20]
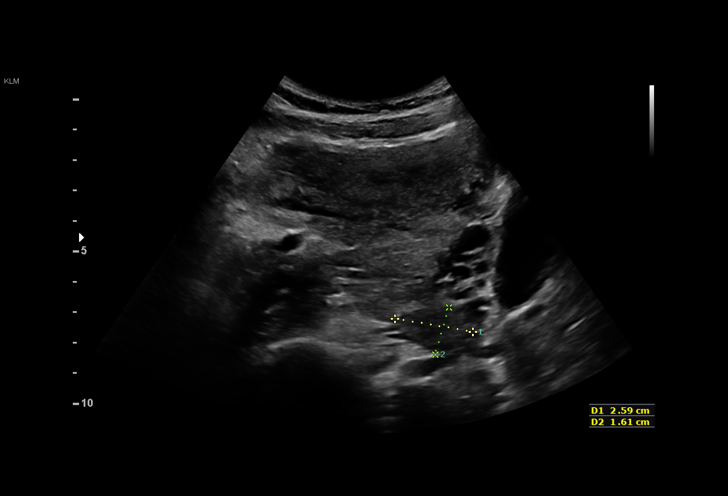
[im 20/20]
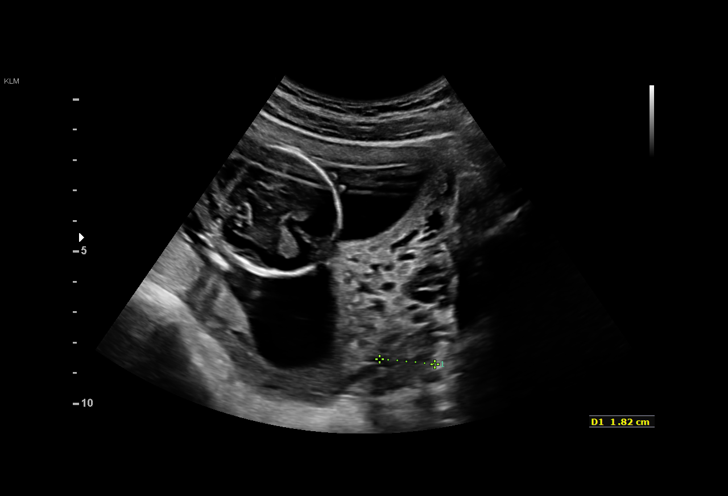

[15 of 20 positions shown; findings below may reference images not displayed]

Attending:        Auri Mu Edita       Secondary Phy.:    [facility]-
MAU/Triage

Indications

17 weeks gestation of pregnancy
Vaginal bleeding in pregnancy, second
trimester
Abdominal pain in pregnancy
OB History

Gravidity:    2         Term:   0        Prem:   0        SAB:   1
TOP:          0       Ectopic:  0        Living: 0
Fetal Evaluation

Num Of Fetuses:     1
Fetal Heart         153
Rate(bpm):
Cardiac Activity:   Observed
Presentation:       Transverse, head to maternal left
Placenta:           Posterior, above cervical os
P. Cord Insertion:  Visualized

Amniotic Fluid
AFI FV:      Subjectively within normal limits

Largest Pocket(cm)
4.7

Comment:    No placental abruption or previa identified.
Gestational Age

LMP:           17w 4d        Date:  02/15/16                 EDD:   11/21/16
Best:          17w 4d     Det. By:  LMP  (02/15/16)          EDD:   11/21/16
Cervix Uterus Adnexa

Cervix
Length:            3.8  cm.
Normal appearance by transabdominal scan.

Uterus
No abnormality visualized.

Left Ovary
Within normal limits.

Right Ovary
Within normal limits.

Cul De Sac:   No free fluid seen.

Adnexa:       No abnormality visualized.
Impression

SIUP at 77w6d
active singleton fetus
amniotic fluid is gestational age appropriate
posterior placenta without demonstration of abruption on
imaging
no previa
cervix is long and closed.
Recommendations

Follow up as clinically indicated.

## 2018-03-31 IMAGING — US US MFM OB COMP +14 WKS
1 series · 14 of 28 positions shown · non-contrast
Comparison: none

[Series 1: us mfm ob comp +14 wks · 14 of 141 slices shown]
[im 6/141]
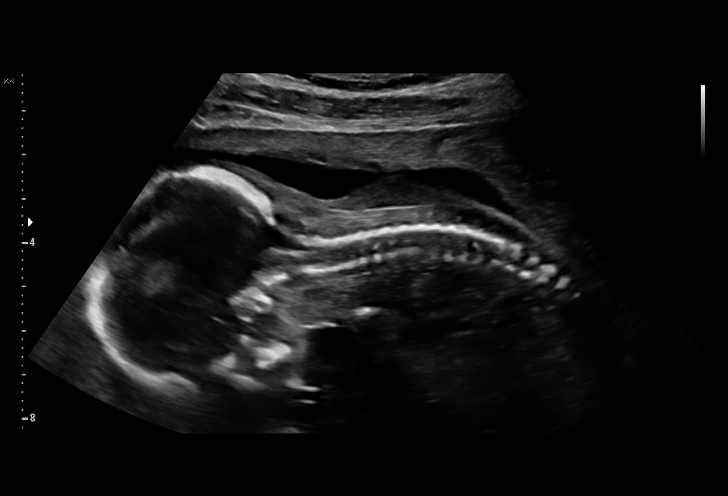
[im 16/141]
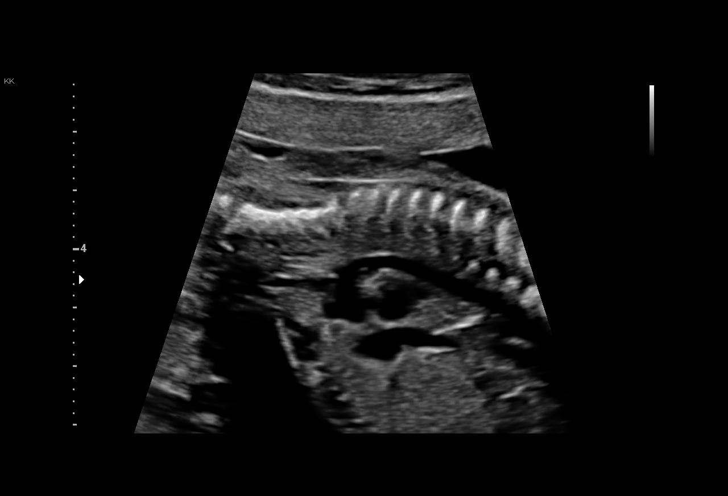
[im 26/141]
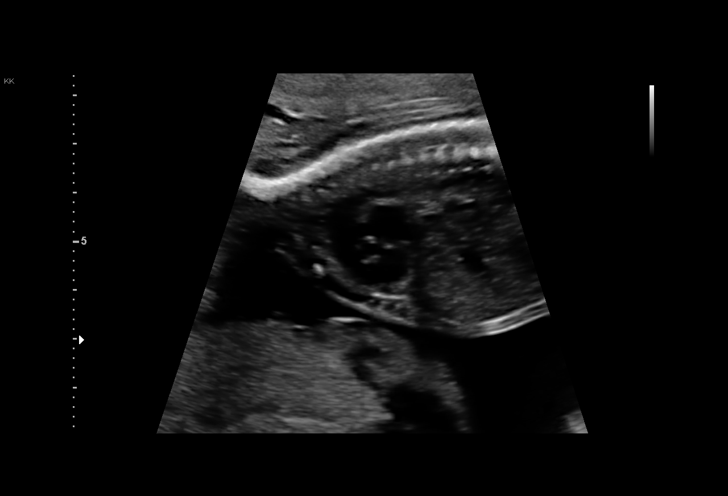
[im 37/141]
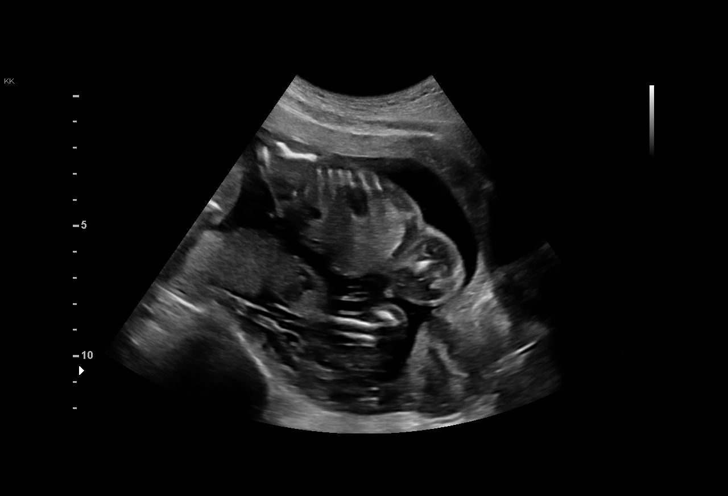
[im 47/141]
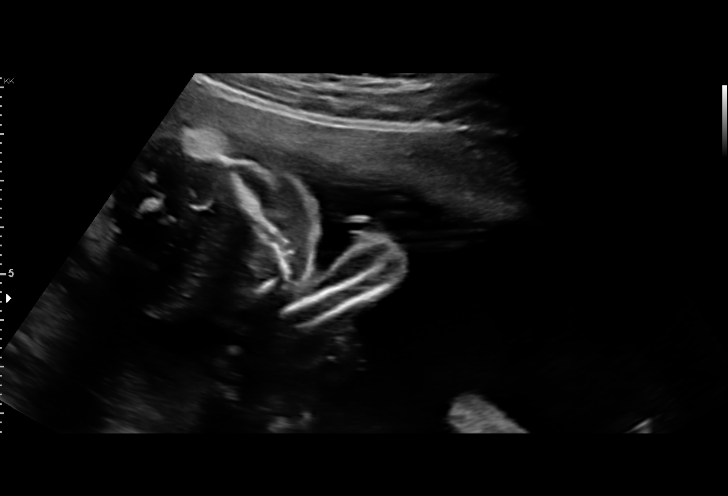
[im 58/141]
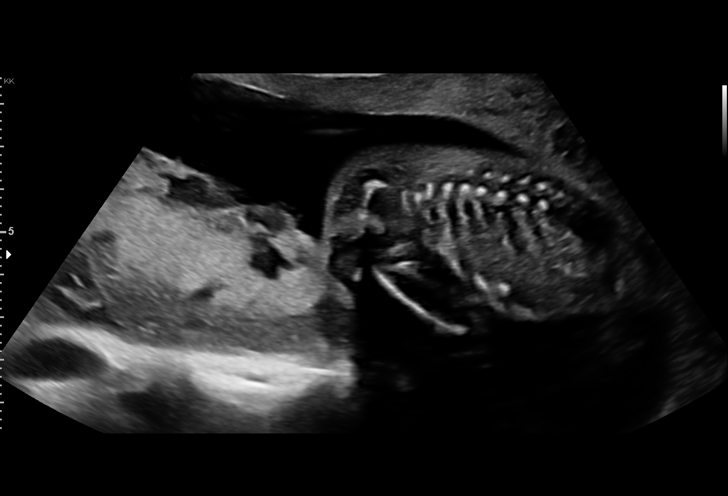
[im 68/141]
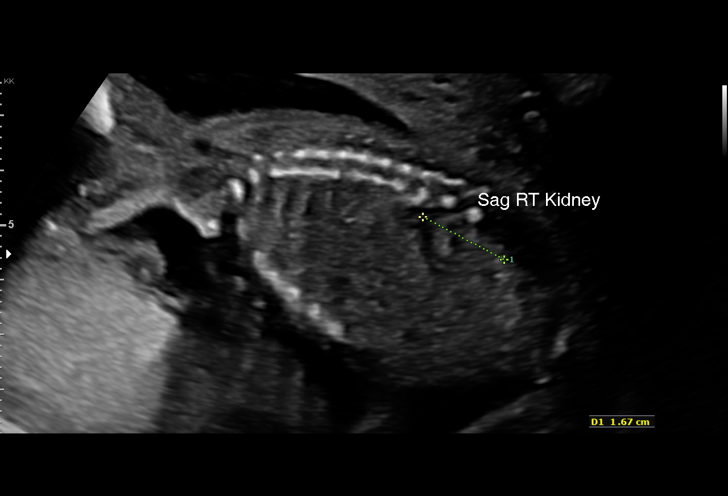
[im 78/141]
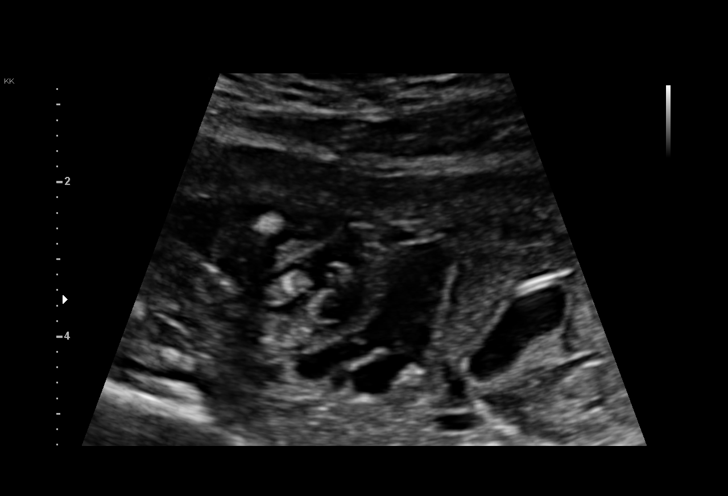
[im 89/141]
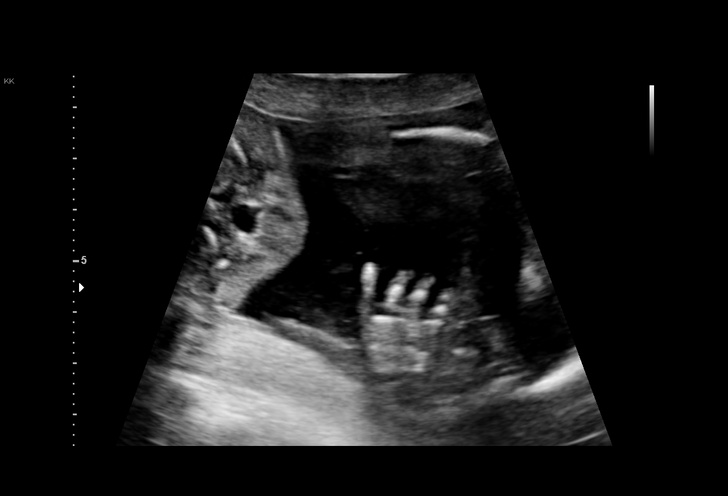
[im 99/141]
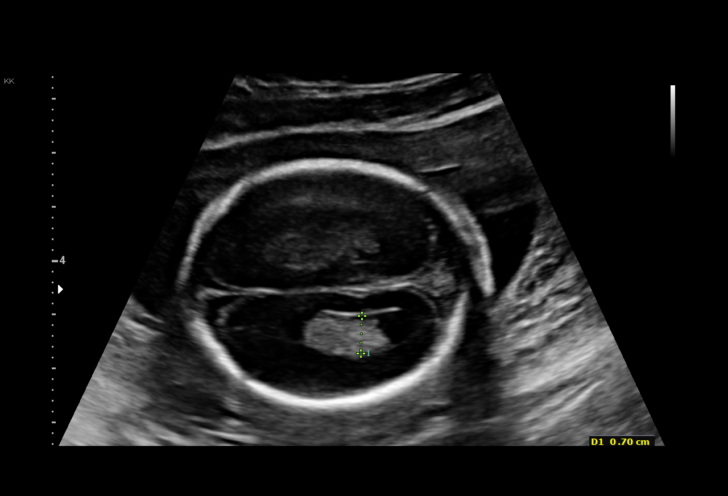
[im 109/141]
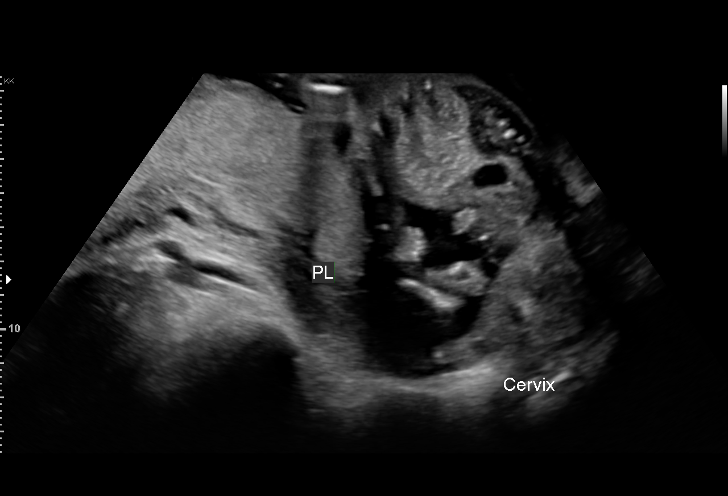
[im 120/141]
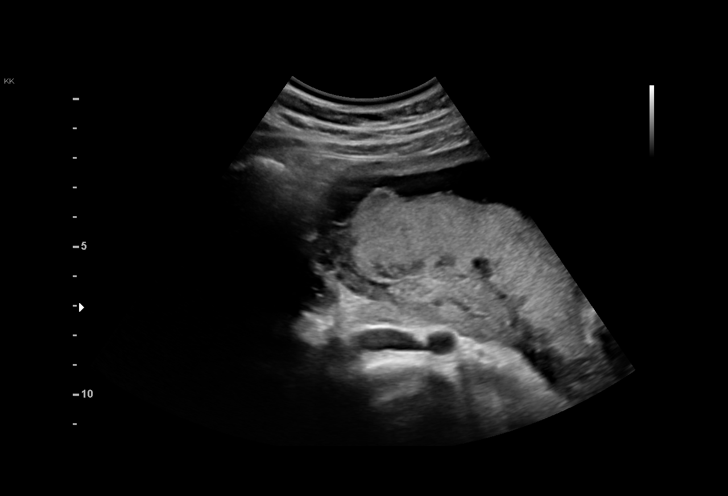
[im 130/141]
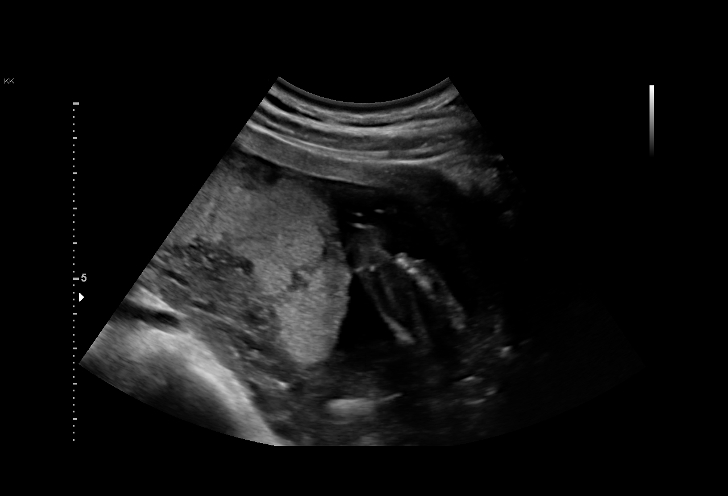
[im 141/141]
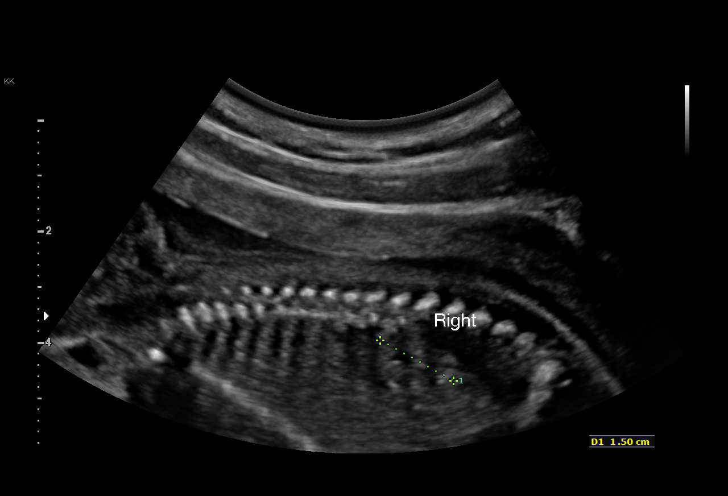

[14 of 28 positions shown; findings below may reference images not displayed]

Indications

20 weeks gestation of pregnancy
Encounter for fetal anatomic survey
OB History

Blood Type:            Height:  5'5"   Weight (lb):  122       BMI:
Gravidity:    2         Term:   0        Prem:   0        SAB:   1
TOP:          0       Ectopic:  0        Living: 0
Fetal Evaluation

Num Of Fetuses:     1
Fetal Heart         150
Rate(bpm):
Cardiac Activity:   Observed
Presentation:       Breech
Placenta:           Posterior, above cervical os
P. Cord Insertion:  Visualized

Amniotic Fluid
AFI FV:      Subjectively within normal limits

Largest Pocket(cm)
3.4
Biometry

BPD:      45.1  mm     G. Age:  19w 4d         34  %    CI:        68.75   %    70 - 86
FL/HC:       18.4  %    16.8 -
HC:      173.8  mm     G. Age:  19w 6d         39  %    HC/AC:       1.04       1.09 -
AC:      167.9  mm     G. Age:  21w 6d         91  %    FL/BPD:      71.0  %
FL:         32  mm     G. Age:  20w 0d         41  %    FL/AC:       19.1  %    20 - 24

Est. FW:     379   gm    0 lb 13 oz     60  %
Gestational Age

LMP:           20w 0d        Date:  02/15/16                 EDD:   11/21/16
U/S Today:     20w 2d                                        EDD:   11/19/16
Best:          20w 0d     Det. By:  LMP  (02/15/16)          EDD:   11/21/16
Anatomy

Cranium:               Appears normal         Aortic Arch:            Appears normal
Cavum:                 Appears normal         Ductal Arch:            Appears normal
Ventricles:            Appears normal         Diaphragm:              Appears normal
Choroid Plexus:        Appears normal         Stomach:                Appears normal, left
sided
Cerebellum:            Appears normal         Abdomen:                Appears normal
Posterior Fossa:       Appears normal         Abdominal Wall:         Appears nml (cord
insert, abd wall)
Nuchal Fold:           Not applicable (>20    Cord Vessels:           Appears normal (3
wks GA)                                        vessel cord)
Face:                  Appears normal         Kidneys:                Appear normal
(orbits and profile)
Lips:                  Appears normal         Bladder:                Appears normal
Thoracic:              Appears normal         Spine:                  Appears normal
Heart:                 Appears normal         Upper Extremities:      Appears normal
(4CH, axis, and situs
RVOT:                  Appears normal         Lower Extremities:      Appears normal
LVOT:                  Appears normal

Other:  Male gender. Heels and 5th digit visualized. Technically difficult due
to fetal position.
Cervix Uterus Adnexa

Cervix
Length:              3  cm.
Normal appearance by transabdominal scan.
Impression

Single IUP at 20w 0d
Normal fetal anatomic survey
Ultrasound measurements are consistent with dates
Posterior placenta without previa
Normal amniotic fluid volume
Recommendations

Follow up ultrasounds as clinically indicated

## 2020-01-01 ENCOUNTER — Encounter: Payer: Self-pay | Admitting: Obstetrics and Gynecology

## 2020-01-01 ENCOUNTER — Other Ambulatory Visit: Payer: Self-pay

## 2020-01-01 ENCOUNTER — Other Ambulatory Visit (HOSPITAL_COMMUNITY)
Admission: RE | Admit: 2020-01-01 | Discharge: 2020-01-01 | Disposition: A | Discharge status: Home / Self Care | Payer: Medicaid Other | Source: Ambulatory Visit | Attending: Obstetrics and Gynecology | Admitting: Obstetrics and Gynecology

## 2020-01-01 ENCOUNTER — Ambulatory Visit (INDEPENDENT_AMBULATORY_CARE_PROVIDER_SITE_OTHER): Payer: Medicaid Other | Admitting: Obstetrics and Gynecology

## 2020-01-01 VITALS — BP 107/68 | HR 79 | Ht 65.0 in | Wt 136.0 lb

## 2020-01-01 DIAGNOSIS — Z30017 Encounter for initial prescription of implantable subdermal contraceptive: Secondary | ICD-10-CM

## 2020-01-01 DIAGNOSIS — Z0000 Encounter for general adult medical examination without abnormal findings: Secondary | ICD-10-CM | POA: Insufficient documentation

## 2020-01-01 DIAGNOSIS — Z3046 Encounter for surveillance of implantable subdermal contraceptive: Secondary | ICD-10-CM

## 2020-01-01 DIAGNOSIS — Z01419 Encounter for gynecological examination (general) (routine) without abnormal findings: Secondary | ICD-10-CM

## 2020-01-01 MED ORDER — ETONOGESTREL 68 MG ~~LOC~~ IMPL
68.0000 mg | DRUG_IMPLANT | Freq: Once | SUBCUTANEOUS | Status: AC
  Administered 2020-01-01: 68 mg via SUBCUTANEOUS

## 2020-01-01 NOTE — Progress Notes (Signed)
Pt states she is doing well.  Would like Nexplanon replaced today. No vaginal complaints, would like STD screening.

## 2020-01-01 NOTE — Progress Notes (Signed)
GYNECOLOGY ANNUAL PREVENTATIVE CARE ENCOUNTER NOTE  History:     Shirley Hall is a 24 y.o. G64P1011 female here for a routine annual gynecologic exam.  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   Denies abnormal vaginal bleeding, discharge, pelvic pain, problems with intercourse or other gynecologic concerns.    Gynecologic History No LMP recorded. Contraception: Nexplanon Last Pap: NA Last mammogram:   Obstetric History OB History  Gravida Para Term Preterm AB Living  2 1 1   1 1   SAB TAB Ectopic Multiple Live Births  1     0 1    # Outcome Date GA Lbr Len/2nd Weight Sex Delivery Anes PTL Lv  2 Term 11/18/16 [redacted]w[redacted]d 09:20 / 00:30 9 lb 2.2 oz (4.145 kg) M Vag-Spont EPI  LIV     Birth Comments: none  1 SAB 11/2015 [redacted]w[redacted]d           Past Medical History:  Diagnosis Date  . Anemia   . Pregnant     Past Surgical History:  Procedure Laterality Date  . NO PAST SURGERIES      Current Outpatient Medications on File Prior to Visit  Medication Sig Dispense Refill  . Prenatal Vit-Fe Fumarate-FA (PRENATAL MULTIVITAMIN) TABS tablet Take 1 tablet by mouth daily at 12 noon.     No current facility-administered medications on file prior to visit.    Allergies  Allergen Reactions  . Amoxicillin Other (See Comments)    States father advised her family history of allergy. Pt has never had this med     Social History:  reports that she has never smoked. She has never used smokeless tobacco. She reports that she does not drink alcohol and does not use drugs.  History reviewed. No pertinent family history.  The following portions of the patient's history were reviewed and updated as appropriate: allergies, current medications, past family history, past medical history, past social history, past surgical history and problem list.  Review of Systems Pertinent items noted in HPI and remainder of comprehensive ROS otherwise negative.  Physical Exam:  BP 107/68    Pulse 79   Ht 5\' 5"  (1.651 m)   Wt 136 lb (61.7 kg)   BMI 22.63 kg/m  CONSTITUTIONAL: Well-developed, well-nourished female in no acute distress.  HENT:  Normocephalic, atraumatic, External right and left ear normal. Oropharynx is clear and moist EYES: Conjunctivae and EOM are normal. Pupils are equal, round, and reactive to light. No scleral icterus.  NECK: Normal range of motion, supple, no masses.  Normal thyroid.  SKIN: Skin is warm and dry. No rash noted. Not diaphoretic. No erythema. No pallor. MUSCULOSKELETAL: Normal range of motion. No tenderness.  No cyanosis, clubbing, or edema.  2+ distal pulses. NEUROLOGIC: Alert and oriented to person, place, and time. Normal reflexes, muscle tone coordination.  PSYCHIATRIC: Normal mood and affect. Normal behavior. Normal judgment and thought content. CARDIOVASCULAR: Normal heart rate noted, regular rhythm RESPIRATORY: Clear to auscultation bilaterally. Effort and breath sounds normal, no problems with respiration noted. BREASTS: Symmetric in size. No masses, tenderness, skin changes, nipple drainage, or lymphadenopathy bilaterally. Performed in the presence of a chaperone. ABDOMEN: Soft, no distention noted.  No tenderness, rebound or guarding.  PELVIC: Normal appearing external genitalia and urethral meatus; normal appearing vaginal mucosa and cervix.  No abnormal discharge noted.  Pap smear obtained.  Normal uterine size, no other palpable masses, no uterine or adnexal tenderness.  Performed in the presence of a  chaperone.   Assessment and Plan:   1. Visit for annual health examination - Cytology - PAP( [place]) - Cervicovaginal ancillary only( [place]) - HIV Antibody (routine testing w rflx) - Hepatitis B surface antigen - RPR - Hepatitis C antibody - etonogestrel (NEXPLANON) implant 68 mg  2. Nexplanon insertion  - etonogestrel (NEXPLANON) implant 68 mg  Will follow up results of pap smear and manage accordingly. Routine  preventative health maintenance measures emphasized. Please refer to After Visit Summary for other counseling recommendations.      Koraima Albertsen, Harolyn Rutherford, NP [facility] [employer], [facility]        GYNECOLOGY OFFICE PROCEDURE NOTE  Carletha Hall is a 24 y.o. G2P1011 here for Nexplanon removal and  Nexplanon re- insertion.  Last pap smear was on 01/01/20 and was normal. No other gynecologic concerns.   Nexplanon Removal and Reinsertion Patient identified, informed consent performed, consent signed.   Patient does understand that irregular bleeding is a very common side effect of this medication. She was advised to have backup contraception for one week after replacement of the implant. Pregnancy test in clinic today was negative.  Appropriate time out taken. Nexplanon site identified in left arm.  Area prepped in usual sterile fashon. One ml of 1% lidocaine was used to anesthetize the area at the distal end of the implant. A small stab incision was made right beside the implant on the distal portion. The Nexplanon rod was grasped using hemostats and removed without difficulty. There was minimal blood loss. There were no complications. Area was then injected with 3 ml of 1 % lidocaine. She was re-prepped with betadine, Nexplanon removed from packaging, Device confirmed in needle, then inserted full length of needle and withdrawn per handbook instructions. Nexplanon was able to palpated in the patient's arm; patient palpated the insert herself.  There was minimal blood loss. Patient insertion site covered with gauze and a pressure bandage to reduce any bruising. The patient tolerated the procedure well and was given post procedure instructions.  She was advised to have backup contraception for one week.      Jaiyden Laur, Harolyn Rutherford, NP [facility] [employer], [facility]

## 2020-01-02 LAB — CERVICOVAGINAL ANCILLARY ONLY
Chlamydia: NEGATIVE
Comment: NEGATIVE
Comment: NORMAL
Neisseria Gonorrhea: NEGATIVE

## 2020-01-02 LAB — CYTOLOGY - PAP: Diagnosis: NEGATIVE

## 2020-01-02 LAB — HEPATITIS C ANTIBODY: Hep C Virus Ab: <0.1 s/co ratio (ref 0.0–0.9)

## 2020-01-02 LAB — RPR: RPR Ser Ql: NONREACTIVE

## 2020-01-02 LAB — HEPATITIS B SURFACE ANTIGEN: Hepatitis B Surface Ag: NEGATIVE

## 2020-01-02 LAB — HIV ANTIBODY (ROUTINE TESTING W REFLEX): HIV Screen 4th Generation wRfx: NONREACTIVE

## 2020-01-06 ENCOUNTER — Telehealth: Payer: Self-pay

## 2020-01-06 NOTE — Telephone Encounter (Signed)
Spoke with patient who believes that she is having an allergic reaction to bandaids she has been putting on her nexplanon insertion site. Pt reports redness and "blisters". I advised pt to leave the site open to air and try benadryl cream. Advised pt to call back if symptoms do not resolve or worsen, pt voices understanding.

## 2020-01-13 ENCOUNTER — Ambulatory Visit (INDEPENDENT_AMBULATORY_CARE_PROVIDER_SITE_OTHER): Payer: Medicaid Other | Admitting: Advanced Practice Midwife

## 2020-01-13 ENCOUNTER — Encounter: Payer: Self-pay | Admitting: Advanced Practice Midwife

## 2020-01-13 ENCOUNTER — Other Ambulatory Visit: Payer: Self-pay

## 2020-01-13 VITALS — BP 102/68 | HR 79 | Wt 136.5 lb

## 2020-01-13 DIAGNOSIS — Z975 Presence of (intrauterine) contraceptive device: Secondary | ICD-10-CM

## 2020-01-13 DIAGNOSIS — Z3046 Encounter for surveillance of implantable subdermal contraceptive: Secondary | ICD-10-CM

## 2020-01-13 DIAGNOSIS — L249 Irritant contact dermatitis, unspecified cause: Secondary | ICD-10-CM

## 2020-01-13 MED ORDER — HYDROCORTISONE 1 % EX OINT: 1.0000 "application " | TOPICAL_OINTMENT | Freq: Two times a day (BID) | CUTANEOUS | 0 refills | Status: AC

## 2020-01-13 NOTE — Progress Notes (Signed)
  GYNECOLOGY PROGRESS NOTE  History:  24 y.o. G2P1011 presents to [facility] office today for problem gyn visit. She reports red, itching swollen skin on her left arm where the bandage was for her Nexplanon insertion. She reports a small scab at the insertion site lasted a few days so she kept a band-aid over it while at work.  She noticed the skin was irritated under the bandage a few days ago.  She called the office and was instructed to try Benadryl cream and remove the bandage. She did remove the bandage but has not put any medications on her arm. She reports she used an ice pack today which was soothing and made the itching better.   She denies h/a, dizziness, shortness of breath, n/v, or fever/chills.    The following portions of the patient's history were reviewed and updated as appropriate: allergies, current medications, past family history, past medical history, past social history, past surgical history and problem list. Last pap smear on 01/01/2020 was normal.  Review of Systems:  Pertinent items are noted in HPI.   Objective:  Physical Exam unknown if currently breastfeeding. VS reviewed, nursing note reviewed,  Constitutional: well developed, well nourished, no distress HEENT: normocephalic CV: normal rate Pulm/chest wall: normal effort Breast Exam: deferred Abdomen: soft Neuro: alert and oriented x 3 Skin: warm, dry, area on inside of upper left arm with square area of erythema, mild edema and shiny skin with some breakdown  Psych: affect normal Pelvic exam: Deferred  Assessment & Plan:  1. Irritant contact dermatitis, unspecified trigger --Rash is square, c/w bandage applied over Nexplanon insertion site. Pt reports a recent reaction to medical tape on a bandage applied by her tattoo artist after a tattoo.   --Listed allergy to medical tape in patient's chart, and she is to avoid tape/bandages whenever possible. --Apply hydrocortisone ointment BID x 7 days --F/U if not  improved in 48 hours  2. Nexplanon in place --No problems, Nexplanon palpated in correct location, small well healed sanguineous crust over Nexplanon insertion site with no edema, errythema, or exudate.  No pain to palpation of Nexplanon site.    Sharen Counter, CNM 8:27 AM

## 2020-01-13 NOTE — Patient Instructions (Signed)
Contact Dermatitis Dermatitis is redness, soreness, and swelling (inflammation) of the skin. Contact dermatitis is a reaction to certain substances that touch the skin. Many different substances can cause contact dermatitis. There are two types of contact dermatitis:  Irritant contact dermatitis. This type is caused by something that irritates your skin, such as having dry hands from washing them too often with soap. This type does not require previous exposure to the substance for a reaction to occur. This is the most common type.  Allergic contact dermatitis. This type is caused by a substance that you are allergic to, such as poison ivy. This type occurs when you have been exposed to the substance (allergen) and develop a sensitivity to it. Dermatitis may develop soon after your first exposure to the allergen, or it may not develop until the next time you are exposed and every time thereafter. What are the causes? Irritant contact dermatitis is most commonly caused by exposure to:  Makeup.  Soaps.  Detergents.  Bleaches.  Acids.  Metal salts, such as nickel. Allergic contact dermatitis is most commonly caused by exposure to:  Poisonous plants.  Chemicals.  Jewelry.  Latex.  Medicines.  Preservatives in products, such as clothing. What increases the risk? You are more likely to develop this condition if you have:  A job that exposes you to irritants or allergens.  Certain medical conditions, such as asthma or eczema. What are the signs or symptoms? Symptoms of this condition may occur on your body anywhere the irritant has touched you or is touched by you.  Symptoms include: ? Dryness or flaking. ? Redness. ? Cracks. ? Itching. ? Pain or a burning feeling. ? Blisters. ? Drainage of small amounts of blood or clear fluid from skin cracks. With allergic contact dermatitis, there may also be swelling in areas such as the eyelids, mouth, or genitals. How is this  diagnosed? This condition is diagnosed with a medical history and physical exam.  A patch skin test may be performed to help determine the cause.  If the condition is related to your job, you may need to see an occupational medicine specialist. How is this treated? This condition is treated by checking for the cause of the reaction and protecting your skin from further contact. Treatment may also include:  Steroid creams or ointments. Oral steroid medicines may be needed in more severe cases.  Antibiotic medicines or antibacterial ointments, if a skin infection is present.  Antihistamine lotion or an antihistamine taken by mouth to ease itching.  A bandage (dressing). Follow these instructions at home: Skin care  Moisturize your skin as needed.  Apply cool compresses to the affected areas.  Try applying baking soda paste to your skin. Stir water into baking soda until it reaches a paste-like consistency.  Do not scratch your skin, and avoid friction to the affected area.  Avoid the use of soaps, perfumes, and dyes. Medicines  Take or apply over-the-counter and prescription medicines only as told by your health care provider.  If you were prescribed an antibiotic medicine, take or apply the antibiotic as told by your health care provider. Do not stop using the antibiotic even if your condition improves. Bathing  Try taking a bath with: ? Epsom salts. Follow the instructions on the packaging. You can get these at your local pharmacy or grocery store. ? Baking soda. Pour a small amount into the bath as directed by your health care provider. ? Colloidal oatmeal. Follow the instructions on the   packaging. You can get this at your local pharmacy or grocery store.  Bathe less frequently, such as every other day.  Bathe in lukewarm water. Avoid using hot water. Bandage care  If you were given a bandage (dressing), change it as told by your health care provider.  Wash your hands  with soap and water before and after you change your dressing. If soap and water are not available, use hand sanitizer. General instructions  Avoid the substance that caused your reaction. If you do not know what caused it, keep a journal to try to track what caused it. Write down: ? What you eat. ? What cosmetic products you use. ? What you drink. ? What you wear in the affected area. This includes jewelry.  Check the affected areas every day for signs of infection. Check for: ? More redness, swelling, or pain. ? More fluid or blood. ? Warmth. ? Pus or a bad smell.  Keep all follow-up visits as told by your health care provider. This is important. Contact a health care provider if:  Your condition does not improve with treatment.  Your condition gets worse.  You have signs of infection such as swelling, tenderness, redness, soreness, or warmth in the affected area.  You have a fever.  You have new symptoms. Get help right away if:  You have a severe headache, neck pain, or neck stiffness.  You vomit.  You feel very sleepy.  You notice red streaks coming from the affected area.  Your bone or joint underneath the affected area becomes painful after the skin has healed.  The affected area turns darker.  You have difficulty breathing. Summary  Dermatitis is redness, soreness, and swelling (inflammation) of the skin. Contact dermatitis is a reaction to certain substances that touch the skin.  Symptoms of this condition may occur on your body anywhere the irritant has touched you or is touched by you.  This condition is treated by figuring out what caused the reaction and protecting your skin from further contact. Treatment may also include medicines and skin care.  Avoid the substance that caused your reaction. If you do not know what caused it, keep a journal to try to track what caused it.  Contact a health care provider if your condition gets worse or you have signs  of infection such as swelling, tenderness, redness, soreness, or warmth in the affected area. This information is not intended to replace advice given to you by your health care provider. Make sure you discuss any questions you have with your health care provider. Document Revised: 07/04/2018 Document Reviewed: 09/27/2017 Elsevier Patient Education  2020 [employer]  

## 2020-01-13 NOTE — Progress Notes (Signed)
Pt is here for check up on her Nexplanon. She reports that she thinks she might be having an allergic reaction to the bandaids she has been using to cover the sight. Pt denies using any creams or medication, she reports the sight is swollen.
